# Patient Record
Sex: Female | Born: 1975 | Race: Black or African American | Hispanic: No | Marital: Single | Smoking: Never smoker
Health system: Southern US, Community
[De-identification: ages and names within clinical notes are randomized; demographics above are authoritative.]

## PROBLEM LIST (undated history)

## (undated) DIAGNOSIS — G800 Spastic quadriplegic cerebral palsy: Secondary | ICD-10-CM

## (undated) DIAGNOSIS — Z87440 Personal history of urinary (tract) infections: Secondary | ICD-10-CM

## (undated) DIAGNOSIS — G40909 Epilepsy, unspecified, not intractable, without status epilepticus: Secondary | ICD-10-CM

## (undated) DIAGNOSIS — F32A Depression, unspecified: Secondary | ICD-10-CM

## (undated) DIAGNOSIS — E785 Hyperlipidemia, unspecified: Secondary | ICD-10-CM

## (undated) DIAGNOSIS — L0390 Cellulitis, unspecified: Secondary | ICD-10-CM

## (undated) DIAGNOSIS — K219 Gastro-esophageal reflux disease without esophagitis: Secondary | ICD-10-CM

## (undated) DIAGNOSIS — D649 Anemia, unspecified: Secondary | ICD-10-CM

## (undated) DIAGNOSIS — F329 Major depressive disorder, single episode, unspecified: Secondary | ICD-10-CM

## (undated) HISTORY — DX: Depression, unspecified: F32.A

## (undated) HISTORY — DX: Spastic quadriplegic cerebral palsy: G80.0

## (undated) HISTORY — DX: Anemia, unspecified: D64.9

## (undated) HISTORY — DX: Personal history of urinary (tract) infections: Z87.440

## (undated) HISTORY — DX: Gastro-esophageal reflux disease without esophagitis: K21.9

## (undated) HISTORY — DX: Cellulitis, unspecified: L03.90

## (undated) HISTORY — DX: Major depressive disorder, single episode, unspecified: F32.9

## (undated) HISTORY — DX: Epilepsy, unspecified, not intractable, without status epilepticus: G40.909

## (undated) HISTORY — PX: REDUCTION MAMMAPLASTY: SUR839

## (undated) HISTORY — DX: Hyperlipidemia, unspecified: E78.5

---

## 1997-07-31 ENCOUNTER — Other Ambulatory Visit: Admission: RE | Admit: 1997-07-31 | Discharge: 1997-07-31 | Payer: Self-pay | Admitting: Obstetrics and Gynecology

## 1998-06-20 ENCOUNTER — Emergency Department (HOSPITAL_COMMUNITY): Admission: EM | Admit: 1998-06-20 | Discharge: 1998-06-20 | Payer: Self-pay | Admitting: Emergency Medicine

## 1998-06-20 ENCOUNTER — Encounter: Payer: Self-pay | Admitting: Emergency Medicine

## 1998-07-13 ENCOUNTER — Encounter: Admission: RE | Admit: 1998-07-13 | Discharge: 1998-10-11 | Payer: Self-pay | Admitting: Family Medicine

## 1999-10-24 ENCOUNTER — Ambulatory Visit (HOSPITAL_COMMUNITY): Admission: RE | Admit: 1999-10-24 | Discharge: 1999-10-24 | Payer: Self-pay | Admitting: General Surgery

## 1999-10-24 ENCOUNTER — Encounter (INDEPENDENT_AMBULATORY_CARE_PROVIDER_SITE_OTHER): Payer: Self-pay | Admitting: Specialist

## 2003-09-03 ENCOUNTER — Encounter: Admission: RE | Admit: 2003-09-03 | Discharge: 2003-09-03 | Payer: Self-pay | Admitting: Internal Medicine

## 2003-09-07 ENCOUNTER — Encounter: Admission: RE | Admit: 2003-09-07 | Discharge: 2003-09-07 | Payer: Self-pay | Admitting: Internal Medicine

## 2003-12-09 ENCOUNTER — Ambulatory Visit: Payer: Self-pay | Admitting: Internal Medicine

## 2004-03-01 ENCOUNTER — Ambulatory Visit: Payer: Self-pay | Admitting: Internal Medicine

## 2004-05-24 ENCOUNTER — Ambulatory Visit: Payer: Self-pay | Admitting: Internal Medicine

## 2004-07-14 ENCOUNTER — Ambulatory Visit: Payer: Self-pay | Admitting: Obstetrics and Gynecology

## 2004-07-21 ENCOUNTER — Ambulatory Visit (HOSPITAL_COMMUNITY): Admission: RE | Admit: 2004-07-21 | Discharge: 2004-07-21 | Payer: Self-pay | Admitting: Internal Medicine

## 2004-08-10 ENCOUNTER — Ambulatory Visit: Payer: Self-pay | Admitting: Internal Medicine

## 2004-08-17 ENCOUNTER — Ambulatory Visit: Payer: Self-pay | Admitting: Obstetrics and Gynecology

## 2004-12-06 ENCOUNTER — Ambulatory Visit: Payer: Self-pay | Admitting: Hospitalist

## 2005-01-23 ENCOUNTER — Ambulatory Visit: Payer: Self-pay | Admitting: Internal Medicine

## 2005-05-10 ENCOUNTER — Encounter: Admission: RE | Admit: 2005-05-10 | Discharge: 2005-08-08 | Payer: Self-pay | Admitting: Internal Medicine

## 2005-06-27 DIAGNOSIS — Z87440 Personal history of urinary (tract) infections: Secondary | ICD-10-CM

## 2005-06-27 DIAGNOSIS — L0390 Cellulitis, unspecified: Secondary | ICD-10-CM

## 2005-06-27 HISTORY — DX: Personal history of urinary (tract) infections: Z87.440

## 2005-06-27 HISTORY — DX: Cellulitis, unspecified: L03.90

## 2005-07-19 ENCOUNTER — Inpatient Hospital Stay (HOSPITAL_COMMUNITY): Admission: AD | Admit: 2005-07-19 | Discharge: 2005-07-25 | Payer: Self-pay | Admitting: Internal Medicine

## 2005-07-19 ENCOUNTER — Ambulatory Visit: Payer: Self-pay | Admitting: Internal Medicine

## 2005-07-22 ENCOUNTER — Encounter (INDEPENDENT_AMBULATORY_CARE_PROVIDER_SITE_OTHER): Payer: Self-pay | Admitting: *Deleted

## 2005-07-23 ENCOUNTER — Encounter: Payer: Self-pay | Admitting: Vascular Surgery

## 2005-07-24 ENCOUNTER — Encounter (INDEPENDENT_AMBULATORY_CARE_PROVIDER_SITE_OTHER): Payer: Self-pay | Admitting: *Deleted

## 2005-08-01 ENCOUNTER — Ambulatory Visit: Payer: Self-pay | Admitting: Internal Medicine

## 2005-08-08 ENCOUNTER — Ambulatory Visit: Payer: Self-pay | Admitting: Internal Medicine

## 2006-01-05 DIAGNOSIS — G809 Cerebral palsy, unspecified: Secondary | ICD-10-CM | POA: Insufficient documentation

## 2006-01-05 DIAGNOSIS — R569 Unspecified convulsions: Secondary | ICD-10-CM | POA: Insufficient documentation

## 2006-01-05 DIAGNOSIS — D638 Anemia in other chronic diseases classified elsewhere: Secondary | ICD-10-CM | POA: Insufficient documentation

## 2006-03-03 DIAGNOSIS — K219 Gastro-esophageal reflux disease without esophagitis: Secondary | ICD-10-CM | POA: Insufficient documentation

## 2006-08-09 ENCOUNTER — Ambulatory Visit: Payer: Self-pay | Admitting: Internal Medicine

## 2006-09-01 ENCOUNTER — Emergency Department (HOSPITAL_COMMUNITY): Admission: EM | Admit: 2006-09-01 | Discharge: 2006-09-01 | Payer: Self-pay | Admitting: Emergency Medicine

## 2006-09-05 ENCOUNTER — Ambulatory Visit: Payer: Self-pay | Admitting: Hospitalist

## 2007-05-31 ENCOUNTER — Encounter (INDEPENDENT_AMBULATORY_CARE_PROVIDER_SITE_OTHER): Payer: Self-pay | Admitting: *Deleted

## 2007-05-31 ENCOUNTER — Ambulatory Visit: Payer: Self-pay | Admitting: Hospitalist

## 2007-07-19 ENCOUNTER — Encounter (INDEPENDENT_AMBULATORY_CARE_PROVIDER_SITE_OTHER): Payer: Self-pay | Admitting: *Deleted

## 2007-08-29 ENCOUNTER — Encounter (INDEPENDENT_AMBULATORY_CARE_PROVIDER_SITE_OTHER): Payer: Self-pay | Admitting: *Deleted

## 2007-12-26 ENCOUNTER — Ambulatory Visit: Payer: Self-pay | Admitting: Internal Medicine

## 2008-08-06 ENCOUNTER — Ambulatory Visit: Payer: Self-pay | Admitting: Internal Medicine

## 2008-08-07 ENCOUNTER — Encounter (INDEPENDENT_AMBULATORY_CARE_PROVIDER_SITE_OTHER): Payer: Self-pay | Admitting: *Deleted

## 2008-08-07 DIAGNOSIS — G8114 Spastic hemiplegia affecting left nondominant side: Secondary | ICD-10-CM | POA: Insufficient documentation

## 2008-08-07 DIAGNOSIS — F329 Major depressive disorder, single episode, unspecified: Secondary | ICD-10-CM

## 2008-09-02 ENCOUNTER — Telehealth: Payer: Self-pay | Admitting: *Deleted

## 2008-09-15 ENCOUNTER — Telehealth (INDEPENDENT_AMBULATORY_CARE_PROVIDER_SITE_OTHER): Payer: Self-pay | Admitting: *Deleted

## 2008-10-28 ENCOUNTER — Encounter (INDEPENDENT_AMBULATORY_CARE_PROVIDER_SITE_OTHER): Payer: Self-pay | Admitting: Internal Medicine

## 2008-12-18 ENCOUNTER — Ambulatory Visit: Payer: Self-pay | Admitting: Internal Medicine

## 2009-03-12 ENCOUNTER — Telehealth (INDEPENDENT_AMBULATORY_CARE_PROVIDER_SITE_OTHER): Payer: Self-pay | Admitting: *Deleted

## 2009-03-17 ENCOUNTER — Telehealth (INDEPENDENT_AMBULATORY_CARE_PROVIDER_SITE_OTHER): Payer: Self-pay | Admitting: Internal Medicine

## 2009-04-07 ENCOUNTER — Telehealth (INDEPENDENT_AMBULATORY_CARE_PROVIDER_SITE_OTHER): Payer: Self-pay | Admitting: Internal Medicine

## 2009-04-27 ENCOUNTER — Encounter: Payer: Self-pay | Admitting: Internal Medicine

## 2009-05-11 ENCOUNTER — Ambulatory Visit: Payer: Self-pay | Admitting: Infectious Diseases

## 2009-05-11 DIAGNOSIS — L0292 Furuncle, unspecified: Secondary | ICD-10-CM | POA: Insufficient documentation

## 2009-05-11 DIAGNOSIS — L0293 Carbuncle, unspecified: Secondary | ICD-10-CM

## 2009-05-12 ENCOUNTER — Encounter (INDEPENDENT_AMBULATORY_CARE_PROVIDER_SITE_OTHER): Payer: Self-pay | Admitting: Internal Medicine

## 2009-05-20 ENCOUNTER — Telehealth (INDEPENDENT_AMBULATORY_CARE_PROVIDER_SITE_OTHER): Payer: Self-pay | Admitting: Internal Medicine

## 2009-06-09 ENCOUNTER — Telehealth (INDEPENDENT_AMBULATORY_CARE_PROVIDER_SITE_OTHER): Payer: Self-pay | Admitting: Internal Medicine

## 2009-07-06 ENCOUNTER — Telehealth: Payer: Self-pay | Admitting: *Deleted

## 2009-07-08 ENCOUNTER — Ambulatory Visit: Payer: Self-pay | Admitting: Internal Medicine

## 2009-07-08 ENCOUNTER — Encounter (INDEPENDENT_AMBULATORY_CARE_PROVIDER_SITE_OTHER): Payer: Self-pay | Admitting: Internal Medicine

## 2009-07-09 LAB — CONVERTED CEMR LAB
HCT: 34.2 % — ABNORMAL LOW (ref 36.0–46.0)
Lymphocytes Relative: 49 % — ABNORMAL HIGH (ref 12–46)
Lymphs Abs: 3.4 10*3/uL (ref 0.7–4.0)
MCV: 81.8 fL (ref >=78.0)
Monocytes Relative: 10 % (ref 3–12)
Neutro Abs: 2.6 10*3/uL (ref 1.7–7.7)
Neutrophils Relative %: 37 % — ABNORMAL LOW (ref 43–77)
Platelets: 259 10*3/uL (ref 150–400)
RDW: 14.3 % (ref 11.5–15.5)

## 2009-07-14 ENCOUNTER — Telehealth (INDEPENDENT_AMBULATORY_CARE_PROVIDER_SITE_OTHER): Payer: Self-pay | Admitting: Internal Medicine

## 2009-07-16 ENCOUNTER — Ambulatory Visit: Payer: Self-pay | Admitting: Infectious Disease

## 2009-07-16 LAB — CONVERTED CEMR LAB
ALT: 8 units/L (ref 0–35)
AST: 16 units/L (ref 0–37)
Albumin: 4.4 g/dL (ref 3.5–5.2)
Creatinine, Ser: 0.39 mg/dL — ABNORMAL LOW (ref 0.40–1.20)
HCT: 35.6 % — ABNORMAL LOW (ref 36.0–46.0)
Hemoglobin: 11.9 g/dL — ABNORMAL LOW (ref 12.0–15.0)
MCHC: 33.4 g/dL (ref 30.0–36.0)
Platelets: 359 10*3/uL (ref 150–400)
Potassium: 3.9 meq/L (ref 3.5–5.3)
Total Bilirubin: 0.2 mg/dL — ABNORMAL LOW (ref 0.3–1.2)
Total Protein: 7.2 g/dL (ref 6.0–8.3)
Valproic Acid Lvl: 71.7 ug/mL (ref >=50.0)
WBC: 8.1 10*3/uL (ref 4.0–10.5)

## 2009-08-02 ENCOUNTER — Encounter (INDEPENDENT_AMBULATORY_CARE_PROVIDER_SITE_OTHER): Payer: Self-pay | Admitting: Internal Medicine

## 2009-10-01 ENCOUNTER — Encounter: Payer: Self-pay | Admitting: Internal Medicine

## 2009-10-06 ENCOUNTER — Encounter: Payer: Self-pay | Admitting: Internal Medicine

## 2009-11-17 ENCOUNTER — Encounter: Payer: Self-pay | Admitting: Internal Medicine

## 2009-11-17 ENCOUNTER — Ambulatory Visit: Payer: Self-pay | Admitting: Internal Medicine

## 2009-11-17 DIAGNOSIS — L84 Corns and callosities: Secondary | ICD-10-CM

## 2009-11-29 LAB — CONVERTED CEMR LAB
Basophils Absolute: 0.1 10*3/uL (ref 0.0–0.1)
Basophils Relative: 1 % (ref 0–1)
CO2: 22 meq/L (ref 19–32)
Calcium: 9.1 mg/dL (ref 8.4–10.5)
Creatinine, Ser: 0.46 mg/dL (ref 0.40–1.20)
Eosinophils Absolute: 0.3 10*3/uL (ref 0.0–0.7)
Hemoglobin: 11.9 g/dL — ABNORMAL LOW (ref 12.0–15.0)
LDL Cholesterol: 138 mg/dL — ABNORMAL HIGH (ref 0–99)
MCHC: 34.5 g/dL (ref 30.0–36.0)
MCV: 81.8 fL (ref >=78.0)
Monocytes Absolute: 0.5 10*3/uL (ref 0.1–1.0)
Neutrophils Relative %: 41 % — ABNORMAL LOW (ref 43–77)
RBC: 4.22 M/uL (ref 3.87–5.11)
RDW: 14.4 % (ref 11.5–15.5)
Sodium: 141 meq/L (ref 135–145)
WBC: 8.2 10*3/uL (ref 4.0–10.5)

## 2009-11-30 ENCOUNTER — Encounter: Payer: Self-pay | Admitting: Internal Medicine

## 2009-11-30 ENCOUNTER — Telehealth: Payer: Self-pay | Admitting: *Deleted

## 2009-12-30 ENCOUNTER — Telehealth: Payer: Self-pay | Admitting: *Deleted

## 2009-12-30 ENCOUNTER — Encounter
Admission: RE | Admit: 2009-12-30 | Discharge: 2010-02-16 | Payer: Self-pay | Source: Home / Self Care | Attending: Internal Medicine | Admitting: Internal Medicine

## 2010-01-10 ENCOUNTER — Encounter: Payer: Self-pay | Admitting: Internal Medicine

## 2010-01-13 ENCOUNTER — Encounter: Payer: Self-pay | Admitting: Internal Medicine

## 2010-03-15 ENCOUNTER — Encounter: Payer: Self-pay | Admitting: Internal Medicine

## 2010-03-21 ENCOUNTER — Encounter: Payer: Self-pay | Admitting: Internal Medicine

## 2010-03-29 NOTE — Miscellaneous (Signed)
Summary: Orders Update   Clinical Lists Changes  Orders: Added new Referral order of Physical Therapy Referral (PT) - Signed 

## 2010-03-29 NOTE — Medication Information (Signed)
[facility]: [facility]   Imported By: Margie Billet 05/28/2009 11:41:54  _____________________________________________________________________  External Attachment:    Type:   Image     Comment:   External Document

## 2010-03-29 NOTE — Progress Notes (Signed)
  Phone Note Other Incoming   Caller: CARE GIVER FOR [name] Details for Reason: REQUESTING PT EVAL Summary of Call: TRACY FORD- CARE GIVER FOR [name] CALLED , REQUESTIN PT EVAL FOR WHEELCHAIR/SHOWERCHAIR FOR [name].  SHE SAID THAT THEY HAD GIVEN [name] A SHOWER BENCH, BUT [name] FELL OFF THE BENCH.   HER CALL BACK NUMBER IS [phone] / FAX NUMBER IS [phone]. THANKS LELA

## 2010-03-29 NOTE — Progress Notes (Signed)
  Phone Note Outgoing Call   Call placed by: Theotis Barrio NT II,  Jul 06, 2009 12:41 PM Call placed to: Specialist Details for Reason: CASE MANAGER/DENIES LEE [phone] Summary of Call: SPOKE WITH DENIES THE CASE MANAGE FOR [name]. ASK HER TO FAX THE REQUEST FOR PT THAT SHE IS TRYING TO GET FOR [name]. THE REFERRAL I FAXED TO PT WAS FORWARDED TO [facility]. THEY ARE UNABLE TO HELP. ASKED HER TO FAXE THIS REQUEST TO [phone] ATTENTION LELA / WITH CALL BACK # [phone]. FOR WHAT EVER REASON THE CASE MANAGE HAS NOT BEEN SUCCESFUL IN GETTING PT SERVICE FROM ANYONE TO COME TO DO THIS EVALUATION. WILL DO WHAT I CAN TO HELP THEM GET DONE, TO GET THE BOTTOMLESS SHOWER CHAIR.  THANKS LELA .

## 2010-03-29 NOTE — Assessment & Plan Note (Signed)
Summary: open  boil on stomach/cfb   Vital Signs:  Patient profile:   35 year old female Height:      64 inches Pulse rate:   71 / minute BP sitting:   102 / 66  (right arm)  Vitals Entered By: Filomena Jungling NT II (May 11, 2009 3:29 PM) CC: BOIL ON  STOMACH Is Patient Diabetic? No Pain Assessment Patient in pain? no       Have you ever been in a relationship where you felt threatened, hurt or afraid?No   Does patient need assistance? Functional Status Self care Ambulation Impaired:Risk for fall Comments [facility]IVES IN [facility]   CC:  BOIL ON  STOMACH.  History of Present Illness: [name] is a 35 yo F with cerebral palsy and seizure d/o who presents for a boil on her stomach. Her caretaker in the group home she inhabitates said that she noticed her scratching her lower stomach on Friday and noticed a red lesion. There was a corresponding red area on her upper thigh where it met the other red lesion through skin folds. She says she has had these before but it does not seem that she has been seen here previously for it. She denies seeing any blistering or drainage, fevers, or chills.  Current Medications (verified): 1)  Depo-Provera 150 Mg/ml Susp (Medroxyprogesterone Acetate) .Marland Kitchen.. 1 Injection Every 3 Months 2)  Depakote Er 250 Mg Tb24 (Divalproex Sodium) .... 250mg  Am and 500mg  Q Hs 3)  Neurontin  Tabs (Gabapentin Tabs) .... 1200mg  By Mouth in The Evening 4)  Protonix 40 Mg Tbec (Pantoprazole Sodium) .... Once Daily 5)  Zoloft 100 Mg Tabs (Sertraline Hcl) .... Once Daily 6)  Oscal 500/200 D-3 500-200 Mg-Unit Tabs (Calcium-Vitamin D) .... Take 1 Tablet By Mouth Two Times A Day 7)  Baclofen 10 Mg  Tabs (Baclofen) .... Take 1 Tablet By Mouth Three Times A Day 8)  Zyrtec Allergy 10 Mg  Tabs (Cetirizine Hcl) .... Take 1 Tablet By Mouth At Bedtime 9)  Tylenol Arthritis Pain 650 Mg Cr-Tabs (Acetaminophen) .... Take 1 Tablet By Mouth Three Times A Day 10)  Wheelchair  Misc  (Misc. Devices) .... Manual Wheechair, Dx Cerebral Palsy (343.9) 11)  Benadryl 25 Mg Caps (Diphenhydramine Hcl) .... Take 1 Tablet At Bedtime As Needed For Itching  Allergies (verified): 1)  ! Dilantin 2)  ! Phenobarbital (Phenobarbital)  Past History:  Past Medical History: Last updated: 01/05/2006 Anemia-NOS Seizure disorder Cerebral palsy with spasticity Depression GERD hx of hemorrhagic UTI in 06/2005 hx of R orbital cellulitis, LUE and R pretibial cellulitis in 06/2005  Family History: Last updated: 08/09/2006 No family hx of diabetes, CAD or SZ disorder.  Social History: Last updated: 08/09/2006 Lives in a group home.  Risk Factors: Smoking Status: never (08/06/2008)  Review of Systems      See HPI  Physical Exam  General:  Well-developed,well-nourished,in no acute distress; alert,appropriate and cooperative throughout examination Head:  Normocephalic and atraumatic without obvious abnormalities. No apparent alopecia or balding. Neurologic:  alert & oriented X3. Pt with cerebral palsy. Skin:  ulcerated 1 cm red lesion on R suprapubic area with similar lesion in upper thigh where they meet via skin fold. Small area of induration palpable. No surrounding erythema or warmth, and no fluid was able to be expressed. Two old, healed lesions on analogous locations of L side. Psych:  Oriented X3.     Impression & Recommendations:  Problem # 1:  FURUNCLE (ICD-680.9) Her lesions  are consistent with a furuncle that she opened up by scratching. I spoke with Dr. Aundria Rud about this (and he examined the patient as well), and they should heal on their own. No evidence of infection. We prescribed Benadryl for her to take at bedtime for itching and encouraged her to not scratch it, if possible. She understood.  Problem # 2:  SEIZURE DISORDER (ICD-780.39) She has not been prescribed Depakote here for a few years, and according to her caretaker, she gets labs done regularly at  [facility] (mental?). So I presume she gets this medication from them and they are monitoring her levels. Nothing further done at this visit.  Her updated medication list for this problem includes:    Depakote Er 250 Mg Tb24 (Divalproex sodium) ..... 250mg  am and 500mg  q hs    Neurontin Tabs (Gabapentin tabs) ..... 1200mg  by mouth in the evening  Complete Medication List: 1)  Depo-provera 150 Mg/ml Susp (Medroxyprogesterone acetate) .Marland Kitchen.. 1 injection every 3 months 2)  Depakote Er 250 Mg Tb24 (Divalproex sodium) .... 250mg  am and 500mg  q hs 3)  Neurontin Tabs (Gabapentin tabs) .... 1200mg  by mouth in the evening 4)  Protonix 40 Mg Tbec (Pantoprazole sodium) .... Once daily 5)  Zoloft 100 Mg Tabs (Sertraline hcl) .... Once daily 6)  Oscal 500/200 D-3 500-200 Mg-unit Tabs (Calcium-vitamin d) .... Take 1 tablet by mouth two times a day 7)  Baclofen 10 Mg Tabs (Baclofen) .... Take 1 tablet by mouth three times a day 8)  Zyrtec Allergy 10 Mg Tabs (Cetirizine hcl) .... Take 1 tablet by mouth at bedtime 9)  Tylenol Arthritis Pain 650 Mg Cr-tabs (Acetaminophen) .... Take 1 tablet by mouth three times a day 10)  Wheelchair Misc (Misc. devices) .... Manual wheechair, dx cerebral palsy (343.9) 11)  Benadryl 25 Mg Caps (Diphenhydramine hcl) .... Take 1 tablet at bedtime as needed for itching  Patient Instructions: 1)  Please schedule an appointment as needed. 2)  I have prescribed Benadryl for you to take at night as needed for itching. Try not to scratch the area because it won't heal as quickly. Prescriptions: BENADRYL 25 MG CAPS (DIPHENHYDRAMINE HCL) take 1 tablet at bedtime as needed for itching  #30 x 3   Entered and Authorized by:   Silvestre Gunner MD   Signed by:   Silvestre Gunner MD on 05/11/2009   Method used:   Print then Give to Patient   RxID:   [id number]   Prevention & Chronic Care Immunizations   Influenza vaccine: Fluvax Non-MCR  (12/18/2008)    Tetanus booster: Not  documented    Pneumococcal vaccine: Not documented  Other Screening   Pap smear: Not documented   Smoking status: never  (08/06/2008)  Appended Document: open  boil on stomach/cfb Call from group home stating fax is not working so I called in rx for benadryl to [facility]

## 2010-03-29 NOTE — Progress Notes (Signed)
Summary: Shower Chair  Phone Note Outgoing Call   Call placed by: Angelina Ok RN,  March 12, 2009 12:29 PM Call placed to: Patient Summary of Call: Call from Vickie wants to get a shower chair for the pt.  Wants it delivered.to [address]/ A Group Home.  Order should go to [facility].  Vickie can be reached at [phone] when order is done. Angelina Ok RN  March 12, 2009 12:32 PM  Initial call taken by: Angelina Ok RN,  March 12, 2009 12:31 PM  Follow-up for Phone Call        ok to order- see med list Follow-up by: Julaine Fusi  DO,  March 12, 2009 12:51 PM  Additional Follow-up for Phone Call Additional follow up Details #1::        Order for Shower Chair faxed to [facility]. Additional Follow-up by: Angelina Ok RN,  March 12, 2009 1:43 PM    New/Updated Medications: ROUND SHOWER STOOL  MISC (MISC. DEVICES) Shower Chair per Med supply. Prescriptions: ROUND SHOWER STOOL  MISC (MISC. DEVICES) Shower Chair per Med supply.  #1 x 0   Entered by:   Julaine Fusi  DO   Authorized by:   Marland Kitchen [facility] ATTENDING DESKTOP   Signed by:   Julaine Fusi  DO on 03/12/2009   Method used:   Print then Give to Patient   RxID:   [id number]

## 2010-03-29 NOTE — Progress Notes (Signed)
Summary: phone/gg  Phone Note From Other Clinic   Summary of Call: Call from Group Home ( [facility])  Leader,  Alanson Puls asking for a Health and Safety assessment  to be done by PT for the patient. Phone # [phone] Fax # [phone]  Initial call taken by: Merrie Roof RN,  June 09, 2009 11:09 AM  Follow-up for Phone Call        PT referral sent. Follow-up by: Silvestre Gunner MD,  June 09, 2009 7:30 PM  Additional Follow-up for Phone Call Additional follow up Details #1::        Lela asked to complete referral Merrie Roof RN  June 16, 2009 9:05 AM

## 2010-03-29 NOTE — Medication Information (Signed)
[facility]: [facility]   Imported By: Margie Billet 08/17/2009 15:28:18  _____________________________________________________________________  External Attachment:    Type:   Image     Comment:   External Document

## 2010-03-29 NOTE — Progress Notes (Signed)
Summary: bedside commode/ hla  Phone Note Other Incoming   Summary of Call: group home calls pt needs a bedside commode, could we get a script for this, fax to [phone], contact person  stephanie edwards, ph# [phone] Initial call taken by: Marin Roberts RN,  March 17, 2009 12:15 PM    Additional Follow-up for Phone Call Additional follow up Details #2::    Do I have to call this in/fax it or can it be done by someone (Glenda?) in the clinic? I'm on night float right now.

## 2010-03-29 NOTE — Miscellaneous (Signed)
Summary: [facility]  [facility]   Imported By: Shon Hough 12/03/2009 14:49:01  _____________________________________________________________________  External Attachment:    Type:   Image     Comment:   External Document

## 2010-03-29 NOTE — Letter (Signed)
[facility]   Imported By: Shon Hough 11/02/2009 10:47:23  _____________________________________________________________________  External Attachment:    Type:   Image     Comment:   External Document

## 2010-03-29 NOTE — Assessment & Plan Note (Signed)
Summary: Paperwork, right knee callus, F/U seizures.   Vital Signs:  Patient profile:   35 year old female Height:      64 inches Temp:     97.4 degrees F oral Pulse rate:   80 / minute BP sitting:   107 / 68  (right arm)  Vitals Entered By: Filomena Jungling NT II (November 17, 2009 4:00 PM) CC: CALLOUS ON KNEE, NEED FLU SHOT Is Patient Diabetic? No Pain Assessment Patient in pain? no       Have you ever been in a relationship where you felt threatened, hurt or afraid?No   Does patient need assistance? Ambulation Impaired:Risk for fall, Wheelchair Comments GROUP HOME HELPS WITH [facility]   Primary Care Provider:  Johnette Abraham DO  CC:  CALLOUS ON KNEE and NEED FLU SHOT.  History of Present Illness: Pt is a 35 yo female with PMHx of traumatic brain injury, cerebral palsy, GERD who presents to clinic today accompanied by her caretaker from her Group Home with multiple concerns, including the following:  1) Paperwork - group home requesting updated paperwork regarding current medications, which include: Depakote (levels monitored frequently in her group home) - no new seizure activity since last visit. Neurontin, Zoloft, Oscal, Baclofen, Zyrtec.  2) Right knee callus and scab - Patient transports herself regularly from her wheelchair onto bed, etc by sliding onto her knees. She has developed chronic calluses on BL knees secondary to this frequent transport. However, she had noticed that 2 days ago, the scab came off of her Right knee callus, causing pain with manipulation. She usually wears a knee pad to cover the wound. No drainage, no pus, no warmth, no redness. No fevers, chills.   3) Preventative Care: Last PAP: reported within last few years with normal result. Not sexually active. No report documented. Will request. FLP: not documented Flu shot requested.     Depression History:      The patient denies a depressed mood most of the day and a diminished interest in her  usual daily activities.         Preventive Screening-Counseling & Management  Alcohol-Tobacco     Smoking Status: never  Pap Smear  Procedure date:  05/07/2009  Findings:      Interpretation/Result:Negative for intraepithelial Lesion or Malignancy.     Current Medications (verified): 1)  Depakote 500 Mg Tbec (Divalproex Sodium) .... Take 2 Tablet At Bedtime, and Half Table At Bed Time 2)  Neurontin  Tabs (Gabapentin Tabs) .... 1200mg  By Mouth in The Evening 3)  Zoloft 100 Mg Tabs (Sertraline Hcl) .... Once Daily 4)  Baclofen 10 Mg  Tabs (Baclofen) .... Take 1 Tablet By Mouth Three Times A Day 5)  Zyrtec Allergy 10 Mg  Tabs (Cetirizine Hcl) .... Take 1 Tablet By Mouth At Bedtime 6)  Tylenol Arthritis Pain 650 Mg Cr-Tabs (Acetaminophen) .... Take 1 Tablet By Mouth Three Times A Day 7)  Wheelchair  Misc (Misc. Devices) .... Manual Wheechair, Dx Cerebral Palsy (343.9) 8)  Benadryl 25 Mg Caps (Diphenhydramine Hcl) .... Take 1 Tablet At Bedtime As Needed For Itching 9)  Oyster Shell Calcium/d 500-200 Mg-Unit Tabs (Calcium-Vitamin D) .... Take 1 Tablet By Mouth Two Times A Day  Allergies: 1)  ! Dilantin 2)  ! Phenobarbital (Phenobarbital)  Review of Systems       Per HPI  Physical Exam  General:  Well-developed,well-nourished,in no acute distress; alert,appropriate and cooperative throughout examination Head:  Normocephalic and atraumatic without obvious abnormalities Mouth:  Oral mucosa and oropharynx without lesions or exudates.   Neck:  No deformities, masses, or tenderness noted. Lungs:  Normal respiratory effort, chest expands symmetrically. Lungs are clear to auscultation, no crackles or wheezes. Heart:  Normal rate and regular rhythm. S1 and S2 normal without gallop, murmur, click, rub or other extra sounds. Abdomen:  Bowel sounds positive,abdomen soft and non-tender without masses, organomegaly Extremities:  Right knee with an opened 1 cm scab over a hardened lesion,  healing, no surrounding erythema or warmth. No discharge of pus. Hardened callus over left knne.    Impression & Recommendations:  Problem # 1:  CALLUS (ICD-700) At this time, right knee lesion appears to be healing and does not look infected.  - Will recommend Duoderm dressing to be changed every 3-5 days - Recommended to continue to monitor for signs of infection including fevers, redness, warmth, discharge.   Problem # 2:  PREVENTIVE HEALTH CARE (ICD-V70.0) - Will repeat labs today, including lipid profile, BMP, CBC, TSH.  - Will request PAP smear results.  - Completed paperwork for Group Home, Re: updated medications list and med reactions.  Orders: T-Lipid Profile [id number]) T-Basic Metabolic Panel [phone])  Problem # 3:  SEIZURE DISORDER (ICD-780.39) Depakote levels checked regularly and adjusted at Group home, per Group home representative. No new seizures.  Her updated medication list for this problem includes:    Depakote 500 Mg Tbec (Divalproex sodium) .Marland Kitchen... Take 2 tablet at bedtime, and half table at bed time    Neurontin Tabs (Gabapentin tabs) ..... 1200mg  by mouth in the evening  Complete Medication List: 1)  Depakote 500 Mg Tbec (Divalproex sodium) .... Take 2 tablet at bedtime, and half table at bed time 2)  Neurontin Tabs (Gabapentin tabs) .... 1200mg  by mouth in the evening 3)  Zoloft 100 Mg Tabs (Sertraline hcl) .... Once daily 4)  Baclofen 10 Mg Tabs (Baclofen) .... Take 1 tablet by mouth three times a day 5)  Zyrtec Allergy 10 Mg Tabs (Cetirizine hcl) .... Take 1 tablet by mouth at bedtime 6)  Tylenol Arthritis Pain 650 Mg Cr-tabs (Acetaminophen) .... Take 1 tablet by mouth three times a day 7)  Wheelchair Misc (Misc. devices) .... Manual wheechair, dx cerebral palsy (343.9) 8)  Benadryl 25 Mg Caps (Diphenhydramine hcl) .... Take 1 tablet at bedtime as needed for itching 9)  Oyster Shell Calcium/d 500-200 Mg-unit Tabs (Calcium-vitamin d) .... Take 1  tablet by mouth two times a day 10)  Duoderm Signal Dressing Misc (Control gel formula dressing) .... Place dressing to right knee wound - change every 3-5 days until scab heals.  Other Orders: T-TSH [id number]) T-CBC w/Diff [id number])  Patient Instructions: 1)  Please follow-up at the clinic in 1year. 2)  You have been started on Duoderm dressings to be replaced every 3-5 days until resolution of your right knee scab.  3)  You will be having labs today. I will call you if any result is abnormal. 4)  If you have worsening of your symptoms, develop fevers, chills, redness, drainage from wound, please call the clinic. Prescriptions: DUODERM SIGNAL DRESSING  MISC (CONTROL GEL FORMULA DRESSING) Place dressing to right knee wound - change every 3-5 days until scab heals.  #10 x 2   Entered and Authorized by:   Johnette Abraham DO   Signed by:   Johnette Abraham DO on 11/17/2009   Method used:   Print then Give to Patient   RxID:   [id number]   Process Orders Check  Orders Results:     [facility]: ABN not required for this insurance Tests Sent for requisitioning (November 23, 2009 3:14 PM):     11/17/2009: [facility] -- T-Lipid Profile [id number] (signed)     11/17/2009: [facility] -- T-Basic Metabolic Panel [id number] (signed)     11/17/2009: [facility] -- T-TSH [phone] (signed)     11/17/2009: [facility] -- [facility] w/Diff [[id number]] (signed)     Prevention & Chronic Care Immunizations   Influenza vaccine: Fluvax Non-MCR  (12/18/2008)    Tetanus booster: Not documented    Pneumococcal vaccine: Not documented  Other Screening   Pap smear: Interpretation/Result:Negative for intraepithelial Lesion or Malignancy.     (05/07/2009)  Reports requested:   Last Pap report requested.  Smoking status: never  (11/17/2009)   Nursing Instructions: Request report of  last Pap   Process Orders Check Orders Results:     [facility]: ABN not required for this insurance Tests Sent for requisitioning (November 23, 2009 3:14 PM):     11/17/2009: [facility] -- T-Lipid Profile [id number] (signed)     11/17/2009: [facility] -- T-Basic Metabolic Panel [id number] (signed)     11/17/2009: [facility] -- T-TSH [phone] (signed)     11/17/2009: [facility] -- T-CBC w/Diff [[id number]] (signed)

## 2010-03-29 NOTE — Miscellaneous (Signed)
Summary: [facility]   Imported By: Louretta Parma 11/02/2009 15:13:42  _____________________________________________________________________  External Attachment:    Type:   Image     Comment:   External Document

## 2010-03-29 NOTE — Progress Notes (Signed)
----   Converted from flag ---- ---- 11/30/2009 12:07 PM, Johnette Abraham DO wrote: Thank you!! Sincerely, Johnette Abraham, D.O.   ---- 11/30/2009 9:51 AM, Chinita Pester RN wrote: I called and talked to [facility] supervisor, Lambert Mody.  I informed her of pt.'s cholesterol level being slightly elevated per Dr. Thad Ranger and faxed [phone]) over Dr. Thad Ranger' dietary recommendations per Tracie's request.  ---- 11/30/2009 8:44 AM, Jeanie Cooks Sturdivant NT II wrote: HI Dilan Fullenwider CAN YOU TAKE CARE OF THIS FOR ME?  THANKS LELA  ---- 11/29/2009 5:14 PM, Johnette Abraham DO wrote: Dorothy Puffer, can you please call Ms. Grassel and let her know I got her lab results, which show that her cholesterol is a little bit elevated. It is not high enough at this point to start medication. But, I would recommend her to make a few dietary changes that will help with bringing her total cholesterol and LDL cholesterol down. Specifically, she can increase the amount of fruits and vegetables in her diet (up to 6 servings daily), decrease foods with trans and saturated fats (such as margarines, cheese, butter, red meats, decrease fried foods, eat more lean meats (chicken, fish). We reassess at next visit. Thank you!  Sincerely, Johnette Abraham, D.O. ------------------------------
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
## 2010-03-29 NOTE — Letter (Signed)
Summary: [facility]   Imported By: Margie Billet 07/12/2009 12:04:19  _____________________________________________________________________  External Attachment:    Type:   Image     Comment:   External Document

## 2010-03-29 NOTE — Assessment & Plan Note (Signed)
Summary: acute-requesting physical for group home paperwork(cfb)/cfb   Vital Signs:  [name]:   35 year old female Height:      64 inches (162.56 cm) Weight:      165.2 pounds (75.09 kg) BMI:     28.46 Temp:     97.4 degrees F (36.33 degrees C) oral Pulse rate:   68 / minute BP sitting:   125 / 70  (right arm)  Vitals Entered By: Stanton Kidney Ditzler RN (Jul 08, 2009 2:53 PM) Is Paige Diabetic? No Pain Assessment Paige in pain? no      Nutritional Status BMI of 25 - 29 = overweight Nutritional Status Detail appetite good  Have you ever been in a relationship where you felt threatened, hurt or afraid?denies   Does Paige need assistance? Functional Status Self care Ambulation Wheelchair Comments Care giver with pt. Ck-up.   Primary Care Provider:  Silvestre Gunner MD   History of Present Illness: 35 year old with Past Medical History: Anemia-NOS Seizure disorder Cerebral palsy with spasticity Depression GERD hx of hemorrhagic UTI in 06/2005 hx of R orbital cellulitis, LUE and R pretibial cellulitis in 06/2005 She present for her annual physical exam. No new complaints. Her healthcare was with Paige in the room. She relates that Paige has been doing well. no new complaints.  I expend more than half hour filling forms.     Depression History:      The Paige denies a depressed mood most of the day and a diminished interest in her usual daily activities.         Preventive Screening-Counseling & Management  Alcohol-Tobacco     Smoking Status: never  Current Medications (verified): 1)  Depakote 500 Mg Tbec (Divalproex Sodium) .... Take 2 Tablet At Bedtime, and Half Table At Bed Time 2)  Neurontin  Tabs (Gabapentin Tabs) .... 1200mg  By Mouth in The Evening 3)  Zoloft 100 Mg Tabs (Sertraline Hcl) .... Once Daily 4)  Oscal 500/200 D-3 500-200 Mg-Unit Tabs (Calcium-Vitamin D) .... Take 1 Tablet By Mouth Two Times A Day 5)  Baclofen 10 Mg  Tabs (Baclofen)  .... Take 1 Tablet By Mouth Three Times A Day 6)  Zyrtec Allergy 10 Mg  Tabs (Cetirizine Hcl) .... Take 1 Tablet By Mouth At Bedtime 7)  Tylenol Arthritis Pain 650 Mg Cr-Tabs (Acetaminophen) .... Take 1 Tablet By Mouth Three Times A Day 8)  Wheelchair  Misc (Misc. Devices) .... Manual Wheechair, Dx Cerebral Palsy (343.9) 9)  Benadryl 25 Mg Caps (Diphenhydramine Hcl) .... Take 1 Tablet At Bedtime As Needed For Itching  Allergies: 1)  ! Dilantin 2)  ! Phenobarbital (Phenobarbital)  Review of Systems  The Paige denies fever, chest pain, syncope, dyspnea on exertion, peripheral edema, prolonged cough, headaches, hemoptysis, abdominal pain, melena, and hematochezia.    Physical Exam  General:  alert, well-developed, and well-nourished.   Head:  normocephalic, atraumatic, and no abnormalities observed.   Eyes:  pupils equal and pupils round.   Ears:  R ear normal and L ear normal.   Nose:  no external deformity, no external erythema, and no nasal discharge.   Mouth:  pharynx pink and moist.   Lungs:  normal respiratory effort, no intercostal retractions, no accessory muscle use, and normal breath sounds.   Heart:  normal rate and regular rhythm.   Abdomen:  soft, non-tender, normal bowel sounds, and no distention.   Extremities:  No edema.    Impression & Recommendations:  Problem # 1:  ANEMIA OF CHRONIC DISEASE (ICD-285.29) I will check CBC. Paige will need anemia panel if HB is low.  Orders: T-CBC w/Diff ([id number])  Problem # 2:  SEIZURE DISORDER (ICD-780.39) Manage by behavioral health.  Her updated medication list for this problem includes:    Depakote 500 Mg Tbec (Divalproex sodium) .Marland Kitchen... Take 2 tablet at bedtime, and half table at bed time    Neurontin Tabs (Gabapentin tabs) ..... 1200mg  by mouth in the evening  Complete Medication List: 1)  Depakote 500 Mg Tbec (Divalproex sodium) .... Take 2 tablet at bedtime, and half table at bed time 2)  Neurontin Tabs  (Gabapentin tabs) .... 1200mg  by mouth in the evening 3)  Zoloft 100 Mg Tabs (Sertraline hcl) .... Once daily 4)  Oscal 500/200 D-3 500-200 Mg-unit Tabs (Calcium-vitamin d) .... Take 1 tablet by mouth two times a day 5)  Baclofen 10 Mg Tabs (Baclofen) .... Take 1 tablet by mouth three times a day 6)  Zyrtec Allergy 10 Mg Tabs (Cetirizine hcl) .... Take 1 tablet by mouth at bedtime 7)  Tylenol Arthritis Pain 650 Mg Cr-tabs (Acetaminophen) .... Take 1 tablet by mouth three times a day 8)  Wheelchair Misc (Misc. devices) .... Manual wheechair, dx cerebral palsy (343.9) 9)  Benadryl 25 Mg Caps (Diphenhydramine hcl) .... Take 1 tablet at bedtime as needed for itching  Paige Instructions: 1)  Please schedule a follow-up appointment in 6 months.  Prevention & Chronic Care Immunizations   Influenza vaccine: Fluvax Non-MCR  (12/18/2008)    Tetanus booster: Not documented    Pneumococcal vaccine: Not documented  Other Screening   Pap smear: Not documented   Smoking status: never  (07/08/2009)      Resource handout printed.  Process Orders Check Orders Results:     [facility]: ABN not required for this insurance Tests Sent for requisitioning (Jul 09, 2009 9:29 AM):     07/08/2009: [facility] -- [facility] w/Diff [[id number]] (signed)    Process Orders Check Orders Results:     [facility]: ABN not required for this insurance Tests Sent for requisitioning (Jul 09, 2009 9:29 AM):     07/08/2009: [facility] -- [facility] w/Diff [[id number]] (signed)

## 2010-03-29 NOTE — Progress Notes (Signed)
Summary: phone/gg  Phone Note Call from Patient   Summary of Call: Call from [facility], care giver at group home.  They want a PT evaluation for bottomless shower chair. They need a written Rx for the evaluation and they will call PT to come out to the home. Fax # [phone]  Initial call taken by: Merrie Roof RN,  May 20, 2009 11:52 AM  Follow-up for Phone Call        Dr Camela Wich out.  I went ahead and did the PT referral.  Anything else I need to do?  thanks Follow-up by: Blanch Media MD,  May 21, 2009 2:17 PM  Additional Follow-up for Phone Call Additional follow up Details #1::        The referral has been faxed.  They will call if a written Rx is needed. Additional Follow-up by: Merrie Roof RN,  May 24, 2009 11:47 AM

## 2010-03-29 NOTE — Miscellaneous (Signed)
Summary: [facility]   Imported By: Shon Hough 11/22/2009 16:51:16  _____________________________________________________________________  External Attachment:    Type:   Image     Comment:   External Document

## 2010-03-29 NOTE — Progress Notes (Signed)
  Phone Note From Other Clinic   Caller: DANA / [facility] Call For: [name] STUDIVANT NTII Details for Reason: UNHABLE TO TO HELP PATIENT WITH BOTTOMLESS CHAIR Summary of Call: DANA FROM [facility] SAYS THAT THEY ARE UNABLE TO HELP WITH GETTING A BOTTOMLESS SHOWER CHAIR, SHE SAID THAT WE NEED TO GET IN TOUCH WITH HER CASE MANAGER. WILL CALL PATIENT TO FIND OUT HER CASE MANAGER AND CONTINUR FROM THERE. Waynette Towers NT II  Jul 06, 2009 12:21 PM

## 2010-03-29 NOTE — Progress Notes (Signed)
Summary: phone/gg  Phone Note From Other Clinic   Summary of Call: received call from Barrett from [phone])  She would like a referral to a GYN for pt. It's time for her annual exam.  She has not been having routine exams and does have vaginal d/c. Initial call taken by: Merrie Roof RN,  April 07, 2009 10:22 AM  Follow-up for Phone Call        That's fine. Will order GYN referral. Follow-up by: Silvestre Gunner MD,  April 14, 2009 2:00 PM  New Problems: VAGINAL DISCHARGE (ICD-623.5)   New Problems: VAGINAL DISCHARGE (ICD-623.5)  Appended Document: phone/gg referral request printed and given to [facility] to process

## 2010-03-29 NOTE — Miscellaneous (Signed)
Summary: REHABILITATION - PHYSICAL THERAPY ORDERS  REHABILITATION - PHYSICAL THERAPY ORDERS   Imported By: Shon Hough 01/18/2010 11:10:30  _____________________________________________________________________  External Attachment:    Type:   Image     Comment:   External Document

## 2010-03-29 NOTE — Progress Notes (Signed)
Summary: phone/gg  Phone Note From Other Clinic   Summary of Call: Call from [place] at group home stating pt had a seizure at 1220 today. This is the senerio that was given to me. Pt was sitting in wheelchair, noted to be staring in space, this lasted about 1 minute. When questioned  after this episode,  [name] was  able to tell the day, she knew her middle name only.  no changes in color, no vomiting,  She was able to push chair with right foot DURING this seizure. Group home director states she has to call and let us know when this happens.  Pt is now back to her normal.  I told them that I would document this and let her MD know.   Initial call taken by: Merrie Roof RN,  Jul 14, 2009 3:17 PM  Follow-up for Phone Call        We should check a valproic acid level on this patient. She apparently had been subtherapeutic on valproic acid in the past when she had a seizure. The last note indicates taht behavioral health is managing the seizure disorder (though that seems a bit unsusual). If we have a slot to work her into later today to check the valproic acid level that would be a good idea. Maybe they can check it in the group home and fax it to [place]?  Follow-up by: Acey Lav MD,  Jul 16, 2009 9:55 AM

## 2010-03-31 NOTE — Miscellaneous (Signed)
Summary: REHAB-INITIAL SUMMARY FOR PT  REHAB-INITIAL SUMMARY FOR PT   Imported By: Margie Billet 03/25/2010 15:52:08  _____________________________________________________________________  External Attachment:    Type:   Image     Comment:   External Document

## 2010-03-31 NOTE — Letter (Signed)
Summary: [facility]  [facility]   Imported By: Margie Billet 03/25/2010 16:44:34  _____________________________________________________________________  External Attachment:    Type:   Image     Comment:   External Document

## 2010-04-07 ENCOUNTER — Telehealth: Payer: Self-pay | Admitting: *Deleted

## 2010-04-07 NOTE — Telephone Encounter (Signed)
Request approved. Handwritten Rx given to [place].  KALIA-REYNOLDS,M SHELLY

## 2010-04-07 NOTE — Telephone Encounter (Signed)
Call from [facility] @ [address] [facility]. Pt had open callus on rt knee and it was treated DuoDerm.  Area is now healed and they need written order to d/c DuoDerm It needs to be faxed into group home. Fax # [phone] ATT: Tresa Endo

## 2010-05-11 ENCOUNTER — Telehealth: Payer: Self-pay | Admitting: *Deleted

## 2010-05-11 NOTE — Telephone Encounter (Signed)
Call from pt's caregiver needs an order for an Immunologist.  Order needs to faxed to [phone].

## 2010-05-13 NOTE — Telephone Encounter (Signed)
Hi Gladys, I left a prescription for this on your table. Please fax accordingly. Thank you!  Sincerely,  Johnette Abraham, D.O.

## 2010-05-16 NOTE — Telephone Encounter (Signed)
Order was faxed to pt's caregiver on Friday.

## 2010-06-02 ENCOUNTER — Ambulatory Visit (INDEPENDENT_AMBULATORY_CARE_PROVIDER_SITE_OTHER): Payer: Self-pay | Admitting: Internal Medicine

## 2010-06-02 ENCOUNTER — Encounter: Payer: Self-pay | Admitting: Internal Medicine

## 2010-06-02 DIAGNOSIS — J309 Allergic rhinitis, unspecified: Secondary | ICD-10-CM

## 2010-06-02 DIAGNOSIS — Z23 Encounter for immunization: Secondary | ICD-10-CM

## 2010-06-02 DIAGNOSIS — Z0000 Encounter for general adult medical examination without abnormal findings: Secondary | ICD-10-CM

## 2010-06-02 MED ORDER — FLUTICASONE PROPIONATE 50 MCG/ACT NA SUSP: 2.0000 | Freq: Every day | NASAL | Status: DC

## 2010-06-02 MED ORDER — CETIRIZINE HCL 10 MG PO TABS: 10.0000 mg | ORAL_TABLET | Freq: Every day | ORAL | Status: AC

## 2010-06-02 NOTE — Patient Instructions (Signed)
1) Please follow-up at the clinic in 3-6 months, at which time we will reevaluate chronic medical conditions. 2) You have been started on new medication(s), if you develop throat closing, tongue swelling, rash, please stop the medication and call the clinic at [phone] and go to the ER. 3) Please bring all of your medications in a bag to your next visit. 4) If you are diabetic, please bring your meter to your next visit. 5) If symptoms worsen, or new symptoms arise, please call the clinic or go to the ER.

## 2010-06-03 ENCOUNTER — Encounter: Payer: Self-pay | Admitting: Internal Medicine

## 2010-06-10 ENCOUNTER — Encounter: Payer: Self-pay | Admitting: Internal Medicine

## 2010-06-10 DIAGNOSIS — Z0000 Encounter for general adult medical examination without abnormal findings: Secondary | ICD-10-CM | POA: Insufficient documentation

## 2010-06-10 DIAGNOSIS — J309 Allergic rhinitis, unspecified: Secondary | ICD-10-CM | POA: Insufficient documentation
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
## 2010-06-10 NOTE — Assessment & Plan Note (Signed)
Continue zyrtec, add fluticasone.

## 2010-06-10 NOTE — Assessment & Plan Note (Signed)
-   Tdap today. - Fill out paperwork for yearly physical, which took approximately 30 minutes in addition to clinic time spent with the patient.  - Follows with [facility] for gyn related matters, does not require regular PAP smears secondary to no prior sexual activity.

## 2010-07-04 ENCOUNTER — Telehealth: Payer: Self-pay | Admitting: *Deleted

## 2010-07-04 NOTE — Telephone Encounter (Signed)
Pt lives in group home, ms weekly calls stating that pt had mentioned that years ago she had breast reduction surgery but the home has no record of it i have briefly reviewed the pt's paper chart, centricity chart and epic w/ nothing being found r/t breast reduction. Ms weekly states while bathing pt yesterday a blue string was found under pt's breast and it is wondered if this could be suture, she states there are no open areas or areas of concern on either breast but desires an appt for pt to have this looked at, she states the string is in a plastic bag and is instructed to bring it to appt.

## 2010-07-04 NOTE — Telephone Encounter (Signed)
Was the blue string penetrating through the skin or was it a loose thread?  Had she been wearing any blue clothing prior to the bath?  Not sure a clinic appointment is necessary if the answer to the first question is no but it is not unreasonable to do a good breat examination to assure there are no breaks in the skin that could represent a retained suture.

## 2010-07-05 ENCOUNTER — Ambulatory Visit (INDEPENDENT_AMBULATORY_CARE_PROVIDER_SITE_OTHER): Payer: Medicaid Other | Admitting: Internal Medicine

## 2010-07-05 VITALS — BP 102/66 | HR 51 | Temp 97.8°F | Wt 150.2 lb

## 2010-07-05 DIAGNOSIS — T148XXA Other injury of unspecified body region, initial encounter: Secondary | ICD-10-CM

## 2010-07-05 DIAGNOSIS — W458XXA Other foreign body or object entering through skin, initial encounter: Secondary | ICD-10-CM

## 2010-07-05 NOTE — Progress Notes (Signed)
  Subjective:    Patient ID: [name], female    DOB: March 01, 1975, 35 y.o.   MRN: [id number]  HPI Remote history of breast surgery Had a buried proline suture in the healed incision on the base of the left breast that worked its way to the surface-her aide seeing it, pulled on it and about a 2 inch suture pulled out They were uderstandably concerned about it-brought the suture in to confirm   Review of Systems     Objective:   Physical Exam  Breast scar well healed-no inflammation at site of suture, not able to express any drainage    Assessment & Plan:   Pt reassured this was simply a SQ suture that worked its way out-nothing needs doing-keep area clean and dry, RTC for any signs of infection

## 2010-07-05 NOTE — Telephone Encounter (Signed)
i ask these questions and was told they were sure it was suture.

## 2010-07-05 NOTE — Patient Instructions (Signed)
COME BACK IF THE AREA OF THE SCAR GETS RED SORE, OR HAS ANY DRAINAGE.  KEEP THAT AREA OF THE LEFT BREAT CLEAN AND DRY

## 2010-07-15 NOTE — Group Therapy Note (Signed)
[name], [name]            ACCOUNT NO.:  [id number]   MEDICAL RECORD NO.:  [id number]          PATIENT TYPE:  WOC   LOCATION:  [facility]                   FACILITY:  [facility]   PHYSICIAN:  Argentina Donovan, MD        DATE OF BIRTH:  1975/04/29   DATE OF SERVICE:  07/14/2004                                    CLINIC NOTE   CHIEF COMPLAINT:  Abnormal bleeding.   HISTORY OF PRESENT ILLNESS:  This is a 35 year old African-American female  with a history of spastic quadriplegia and moderate mental retardation,  coming in today after being referred by [facility] for  abnormal bleeding __________ tissue.  She was begun on Depo-Provera 150 mg  in July of 2005 in an effort to stop her menses as menses were very messy  and difficult to deal with with the patient.  After beginning in July, in  December, they started noticing that the patient was having abnormal  bleeding described as very light bleeding to spotting on and off for 3 weeks  consistently.  At that time, I discussed this with the physician, and it was  felt that this may be abnormal bleeding.  The patient was followed for some  time, continued to have irregular bleeding.  She was given Provera once a  day for one month in March which began bleeding, and the patient continued  to have irregular spotting and light bleeding throughout.  The patient has  been getting Depo every 3 months since the beginning.  However, has been  late on what appears to be more than one occasion.  Last menstrual period  was May 17, 2004.  Last Pap smear was approximately 7 years ago.   CURRENT MEDICATIONS:  1.  Zoloft 100 mg.  2.  Depakote ER 500 mg.  3.  Gabapentin 400 mg.   PAST MEDICAL HISTORY:  Epilepsy, moderate mental retardation, spastic  quadriplegia.   OBJECTIVE:  VITAL SIGNS:  Vital signs noted.  Stable, and within normal  limits.  GENERAL:  The patient is alert in no apparent distress.  No physical exam   performed.   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  The patient's pattern of  bleeding sounds very consistent with Depo-Provera as Depo is related to  definitely having irregular bleeding, and eventually, the patient will  likely not have menses at all, but in the interim, she will have irregular  bleeding.  This is explained to the family, and they understand.  They would  like to continue having the Depo in an effort to help the patient get rid of  her menses.  It was also explained to the patient and her family that  considering she has never had any sexual activity that it would be okay if  she did not have to have a Pap smear considering that the patient would need  to be sedated in order to have that done.  We will get a pelvic ultrasound  today to evaluate the anatomy, specifically the endometrial stripe, to  ensure there are not any other  problems.  The patient will continue to  follow up here every 3 months for Depo-Provera.      AD/MEDQ  D:  07/14/2004  T:  07/14/2004  Job:  [id number]

## 2010-07-15 NOTE — Discharge Summary (Signed)
[name], [name]            ACCOUNT NO.:  [id number]   MEDICAL RECORD NO.:  [id number]          PATIENT TYPE:  INP   LOCATION:  3011                         FACILITY:  [facility]   PHYSICIAN:  Ileana Roup, M.D.  DATE OF BIRTH:  1975/10/02   DATE OF ADMISSION:  07/19/2005  DATE OF DISCHARGE:  07/25/2005                                 DISCHARGE SUMMARY   DISCHARGE DIAGNOSES:  1.  Cellulitis of the right orbit/nasal bridge, left upper extremity, right      pretibial region, likely multifocal.  2.  Cerebral palsy.  3.  Seizure disorder.  4.  Normocytic anemia, likely anemia of chronic disease.  5.  Mild hemorrhagic cystitis.   DISCHARGE CONDITION:  Stable.   DISCHARGE MEDICATIONS:  1.  Baclofen 10 mg t.i.d.  2.  Protonix 40 mg daily.  3.  Senokot one tablet daily.  4.  Depakote 250 mg daily.  5.  Zoloft 100 mg daily.  6.  Os-Cal plus D one tablet b.i.d.  7.  Neurontin 1200 mg q.h.s.  8.  Depakote 1000 mg q.h.s.  9.  Levofloxacin 750 mg x7 more days.   CONSULTATIONS:  None.   PROCEDURES:  1.  CT maxillofacial and neck, Jul 20, 2005, demonstrating malformation and      cerebellar hypoplasia as well as hypo-attenuation in the left frontal      lobe compatible with remote ischemic infarction.  2.  No orbital cellulitis.  3.  No acute findings to the neck.  4.  CT of the abdomen and pelvis without contrast, which was negative, on      Jul 22, 2005.  5.  A PICC line was placed on Jul 20, 2005, and discontinue the day prior to      discharge.   DISPOSITION:  The patient will be discharged back to her group home.   HISTORY OF PRESENT ILLNESS:  [name] is a 35 year old African-American  woman with a past medical history significant for cerebral palsy and seizure  disorder who presented with a 4-5 day history of lethargy and nausea 2 days  prior to admission, with acute development of a large knot to the right  forehead which was not responsive to cold packs or  Benadryl.  She began to  have pain and swelling with erythema over this region, which also extended  across the nasal bridge.   REVIEW OF SYSTEMS:  Significant for fevers and chills with decreased  appetite.  She also had some swelling and erythema to the right lateral shin  with induration and __________, as well as the left lateral deltoid with 2 x  2 cm areas, all of which were demonstrating an infected  carbuncle/folliculitis.   ADMISSION PHYSICAL EXAMINATION:  VITAL SIGNS:  Temperature 102.2, blood  pressure 113/61, pulse 86, respirations 18.  GENERAL:  Only significant for severe tenderness and swelling with erythema  to the right forehead across the nasal bridge with warmth to the touch.  The  right lateral shin was noted to be 4 x 4 cm indurated lesion with no acute  drainage.  Also, the left lateral  deltoid region on the upper arm showed a 2  x 2 cm region demonstrating no drainage, but was erythematous and warm to  the touch.  NEUROLOGIC:  The patient had significant contractures secondary to her  cerebral palsy, particular in the bilateral lower extremities, as well as  decreased range of motion to her left upper extremity at the elbow, wrist  and fingers.  Her speech was consistent with cerebral palsy, but was  intelligible and coherent.   HOSPITAL COURSE:  1.  Cellulitis of the face, shin and arm.  The patient had blood cultures      drawn and was started on IV vancomycin and Zosyn therapy, but      unfortunately there was no obvious area which could be aspirated for      culture.  Blood cultures were obtained which were negative for growth.      Over the next few days, the patient responded remarkably to IV therapy,      and we felt that because of her marked improvement that we could switch      to an oral regimen.  Of note, the patient did have a few days thereafter      a mild case of hematuria noted to her Foley bag, which, on urinalysis,      demonstrated some  trace leukocyte esterase with 3-6 white blood cells      and too numerous to count red blood cells; however, Gram stain was      unremarkable.  There was, however, insignificant growth noted to that      particular culture.  As such, levofloxacin was chosen for oral regimen,      which would help cover both organisms.  She responded remarkable after      essentially 7 days of therapy and was advised to continue another 7 days      to complete a 14-day course.  2.  Cerebral palsy.  The patient was continued on her baclofen for muscle      spasms and gabapentin, as well as Depakote therapy, with no difficulties      noted during this admission.  In addition, she had physical therapy 3      times a week, which helped her remarkably with her stretching exercises      and other physical limitations.  3.  Hematuria.  As noted above, during this hospitalization, the patient had      a Foley catheter placed, and over the course of a couple of days there      was noted a trace amount of hematuria.  The first urinalysis on Jul 22, 2005 demonstrated trace leukocyte esterase with 3-6 white blood cells      and too numerous to count red blood cells, and by culture showed no      specific growth.  Urinalysis on admission, however, had been      unremarkable.  Because of this disparity, we felt that the patient may      have encountered some kind of hemorrhagic cystitis, and there was no      evidence of trauma on initial insertion of her Foley catheter.  By the      day of discharge, after removal of the Foley catheter, there was no      evidence of further hemorrhagic urination.  A CT of the abdomen and      pelvis was obtained to rule out any kind of  kidney stones, and it was      essentially unremarkable.  4.  Normocytic anemia.  Of note, the patient has had a long history of      normocytic anemia with no specific testing noted on the E-chart.  Iron     studies demonstrated an elevated ferritin  of 412 with serum iron of 12.      Vitamin B-12 was also elevated at 1308 with an RBC folate of 342.      Erythropoietin was drawn, which was normal at 28.3 million IU/mL.  Stool      for occult blood was obtained for 5 samples, only 1 of which was      positive.  Reticulocyte count was 0.6% when her hematocrit was 30.6%.      Likely, the patient may have a baseline normocytic anemia      from anemia of chronic disease.  Further testing to include LVH was      unremarkable for any evidence of hemolytic process.  At some point,      perhaps, the patient would benefit from bone marrow biopsy to further      quantify and evaluate this disorder.      Coralie Carpen, M.D.    ______________________________  Ileana Roup, M.D.    FR/MEDQ  D:  07/25/2005  T:  07/25/2005  Job:  [id number]   cc:   Alvin Critchley(?), M.D.  Outpatient Clinic

## 2010-08-27 ENCOUNTER — Ambulatory Visit (INDEPENDENT_AMBULATORY_CARE_PROVIDER_SITE_OTHER): Payer: Medicaid Other

## 2010-08-27 ENCOUNTER — Inpatient Hospital Stay (INDEPENDENT_AMBULATORY_CARE_PROVIDER_SITE_OTHER)
Admission: RE | Admit: 2010-08-27 | Discharge: 2010-08-27 | Disposition: A | Discharge status: Home / Self Care | Payer: Medicaid Other | Source: Ambulatory Visit | Attending: Family Medicine | Admitting: Family Medicine

## 2010-08-27 DIAGNOSIS — R6889 Other general symptoms and signs: Secondary | ICD-10-CM

## 2010-09-29 ENCOUNTER — Other Ambulatory Visit: Payer: Self-pay | Admitting: *Deleted

## 2010-09-29 MED ORDER — BACLOFEN 10 MG PO TABS: 10.0000 mg | ORAL_TABLET | Freq: Three times a day (TID) | ORAL | Status: DC

## 2011-01-12 ENCOUNTER — Encounter: Payer: Self-pay | Admitting: Internal Medicine

## 2011-01-12 DIAGNOSIS — F639 Impulse disorder, unspecified: Secondary | ICD-10-CM | POA: Insufficient documentation

## 2011-01-12 DIAGNOSIS — F71 Moderate intellectual disabilities: Secondary | ICD-10-CM | POA: Insufficient documentation

## 2011-04-17 ENCOUNTER — Ambulatory Visit (INDEPENDENT_AMBULATORY_CARE_PROVIDER_SITE_OTHER): Payer: Medicaid Other | Admitting: Internal Medicine

## 2011-04-17 ENCOUNTER — Encounter: Payer: Self-pay | Admitting: Internal Medicine

## 2011-04-17 VITALS — BP 117/68 | HR 73 | Temp 97.7°F | Wt 146.2 lb

## 2011-04-17 DIAGNOSIS — Z0000 Encounter for general adult medical examination without abnormal findings: Secondary | ICD-10-CM

## 2011-04-17 DIAGNOSIS — G40909 Epilepsy, unspecified, not intractable, without status epilepticus: Secondary | ICD-10-CM

## 2011-04-17 DIAGNOSIS — D638 Anemia in other chronic diseases classified elsewhere: Secondary | ICD-10-CM

## 2011-04-17 DIAGNOSIS — R05 Cough: Secondary | ICD-10-CM | POA: Insufficient documentation

## 2011-04-17 DIAGNOSIS — F329 Major depressive disorder, single episode, unspecified: Secondary | ICD-10-CM

## 2011-04-17 DIAGNOSIS — R569 Unspecified convulsions: Secondary | ICD-10-CM

## 2011-04-17 LAB — CBC
MCH: 27.6 pg (ref 26.0–34.0)
MCV: 85.1 fL (ref 78.0–100.0)
RBC: 4.56 MIL/uL (ref 3.87–5.11)
RDW: 13.1 % (ref 11.5–15.5)
WBC: 7.1 10*3/uL (ref 4.0–10.5)

## 2011-04-17 LAB — COMPREHENSIVE METABOLIC PANEL
ALT: <8 U/L (ref 0–35)
AST: 19 U/L (ref 0–37)
BUN: 9 mg/dL (ref 6–23)
CO2: 26 mEq/L (ref 19–32)
Calcium: 10.1 mg/dL (ref 8.4–10.5)
Chloride: 104 mEq/L (ref 96–112)
Creat: 0.41 mg/dL — ABNORMAL LOW (ref 0.50–1.10)
Glucose, Bld: 74 mg/dL (ref 70–99)
Potassium: 4.3 mEq/L (ref 3.5–5.3)
Sodium: 142 mEq/L (ref 135–145)
Total Protein: 7.1 g/dL (ref 6.0–8.3)

## 2011-04-17 LAB — LIPID PANEL
Cholesterol: 203 mg/dL — ABNORMAL HIGH (ref 0–200)
HDL: 39 mg/dL — ABNORMAL LOW (ref >39)
LDL Cholesterol: 140 mg/dL — ABNORMAL HIGH (ref 0–99)
VLDL: 24 mg/dL (ref 0–40)

## 2011-04-17 MED ORDER — BENZONATATE 100 MG PO CAPS: 100.0000 mg | ORAL_CAPSULE | Freq: Four times a day (QID) | ORAL | Status: DC | PRN

## 2011-04-17 NOTE — Assessment & Plan Note (Signed)
Assessment: Status:  Has not had seizures in several years according to [name] and caregiver. Last dilantin level checked in 2011.  Disease Control: controlled  Progress toward goals: at goal  Barriers to meeting goals: no barriers identified   Plan:      continue current medications  Check Valproic acid levels today.

## 2011-04-17 NOTE — Assessment & Plan Note (Addendum)
   Defer flu shot to next visit when not acutely ill.  Check CMET, fasting lipid panel.
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
## 2011-04-17 NOTE — Assessment & Plan Note (Signed)
Likely secondary to acute URI given acuity of symptoms and that she lives in a group home. There is no evidence of pneumonia with clear lung exam and lack of fevers. May also be contributed by her allergic rhinitis (although less likely given she is currently already on treatment and notes stability of her nasal congestion, etc), versus GERD (given hx but no current active treatment - she herself does not recall even having this diagnosis).  Tx with antitussive PRN  Pt instructed to return in 4-5 weeks if persistent cough, at which time can consider trial of PPI or adjustment of allergic rhinitis regimen as indicated.  She is to call if developing fevers, chills, worsening symptoms.

## 2011-04-17 NOTE — Assessment & Plan Note (Signed)
CBC:    Component Value Date/Time   WBC 8.2 11/17/2009 2307   HGB 11.9* 11/17/2009 2307   HCT 34.5* 11/17/2009 2307   PLT 282 11/17/2009 2307   MCV 81.8 11/17/2009 2307   NEUTROABS 3.3 11/17/2009 2307   LYMPHSABS 4.1* 11/17/2009 2307   MONOABS 0.5 11/17/2009 2307   EOSABS 0.3 11/17/2009 2307   BASOSABS 0.1 11/17/2009 2307    Assessment: Status: patient does still menstruate,  Although less blood flow as compared to prior.   Plan:      Last cbc was 10/2009. Will recheck today.  Check anemia panel today.

## 2011-04-17 NOTE — Patient Instructions (Signed)
   Please follow-up at the clinic in 6 months - OR, please follow-up in the clinic sooner if needed (especially if the cough isn't going away in the next 4-5 weeks)  There have been changes in your medications:  START tessalon perles as needed for your cough.    If you have been started on new medication(s), and you develop symptoms concerning for allergic reaction, including, but not limited to, throat closing, tongue swelling, rash, please stop the medication immediately and call the clinic at [phone], and go to the ER.  If symptoms worsen, or new symptoms arise, please call the clinic or go to the ER.  Please bring all of your medications in a bag to your next visit.

## 2011-04-17 NOTE — Assessment & Plan Note (Signed)
Pt is currently taking Sertraline and is controlled on this medication.   Plan:  Continue current regimen.

## 2011-04-18 LAB — VALPROIC ACID LEVEL: Valproic Acid Lvl: 92 ug/mL (ref 50.0–100.0)

## 2011-05-01 ENCOUNTER — Ambulatory Visit (INDEPENDENT_AMBULATORY_CARE_PROVIDER_SITE_OTHER): Payer: Medicaid Other | Admitting: Obstetrics and Gynecology

## 2011-05-01 ENCOUNTER — Ambulatory Visit: Payer: Self-pay

## 2011-05-01 DIAGNOSIS — Z01419 Encounter for gynecological examination (general) (routine) without abnormal findings: Secondary | ICD-10-CM

## 2011-06-20 ENCOUNTER — Telehealth: Payer: Self-pay | Admitting: *Deleted

## 2011-06-20 ENCOUNTER — Ambulatory Visit (INDEPENDENT_AMBULATORY_CARE_PROVIDER_SITE_OTHER): Payer: Medicaid Other | Admitting: Internal Medicine

## 2011-06-20 VITALS — BP 103/59 | HR 72 | Temp 97.0°F | Ht 65.0 in | Wt 150.0 lb

## 2011-06-20 DIAGNOSIS — L299 Pruritus, unspecified: Secondary | ICD-10-CM

## 2011-06-20 DIAGNOSIS — L209 Atopic dermatitis, unspecified: Secondary | ICD-10-CM | POA: Insufficient documentation

## 2011-06-20 MED ORDER — TRIAMCINOLONE ACETONIDE 0.5 % EX CREA: TOPICAL_CREAM | Freq: Three times a day (TID) | CUTANEOUS | Status: DC

## 2011-06-20 NOTE — Progress Notes (Signed)
Patient ID: [name], female   DOB: [birth year], 36 y.o.   MRN: [id number]  36 year old woman with past medical history listed below comes to clinic complaining of itching in her legs mostly and also her left arm for past one month She had some itching before as well but this is much worse than usual. She does not remember any specific triggers that may have caused it but he only thing that has changed recently his head there is a new dog at group home that she lives in No change in medication recently No rashes, hives, sneezing, rhinorrhea or any other complaints Uses Benadryl for itching which works well  Physical exam   General Appearance:     Filed Vitals:   06/20/11 1452 06/20/11 1500  BP: 103/59   Pulse: 72   Temp: 97 F (36.1 C)   TempSrc: Oral   Height:  5\' 5"  (1.651 m)  Weight:  150 lb (68.04 kg)  SpO2: 100%      Alert, cooperative, no distress, appears stated age  Head:    Normocephalic, without obvious abnormality, atraumatic  Eyes:    PERRL, conjunctiva/corneas clear, EOM's intact, fundi    benign, both eyes       Neck:   Supple, symmetrical, trachea midline, no adenopathy;       thyroid:  No enlargement/tenderness/nodules; no carotid   bruit or JVD  Lungs:     Clear to auscultation bilaterally, respirations unlabored  Chest wall:    No tenderness or deformity  Heart:    Regular rate and rhythm, S1 and S2 normal, no murmur, rub   or gallop  Abdomen:     Soft, non-tender, bowel sounds active all four quadrants,    no masses, no organomegaly  Extremities:   dry skin and scratch marks on both her lower extremities as well as arms , bruises from trauma on her shin at 2 places-it is healing well    Pulses:   2+ and symmetric all extremities  Skin:   Skin color, texture, turgor normal, no rashes or lesions  Neurologic:  nonfocal grossly  Review of system - as per history of present illness

## 2011-06-20 NOTE — Assessment & Plan Note (Signed)
Differentials include allergy to the dog, dry skin, medication, anxiety, environmental She had chemistry checked last month which was normal  Plan - Continue Benadryl - Prescribe steroid topical cream - Stay away from the dog - Zyrtec for systemic allergy - Petroleum jelly or other moisturizing cream every day - Followup in one month if no improvement

## 2011-06-20 NOTE — Telephone Encounter (Signed)
Kelly called  and stated pt left foot is swollen and area on left leg has dry skin and red. Has had problems in past. Appt 06/20/11 2:45PM Dr Scot Dock. Stanton Kidney Gregg Holster RN 06/20/11 10AM

## 2011-06-20 NOTE — Patient Instructions (Signed)
Itching  Itching is a symptom that can be caused by many things. These include skin problems (including infections) as well as some internal diseases.   If the itching is affecting just one area of the body, it is most likely due to a common skin problem, such as:   Poison oak and poison ivy.   Contact dermatitis (skin irritation from a plant, chemicals, fiberglass, detergents, new cosmetic, new jewelry, or other substance).   Fungus (such as athlete's foot, jock itch, or ringworm).   Head lice   Dandruff   Insect bite   Infection (such as Shingles or other virus infections).  If the itching is all over (widespread), the possible causes are many. These include:    Dry skin or eczema   Heat rash   Hives   Liver disorders   Kidney disorders  TREATMENT   Localized itching    Lubrication of the skin. Use an ointment or cream or other unperfumed moisturizers if the skin is dry. Apply frequently, especially after bathing.   Anti-itch medicines. These medications may help control the urge to scratch. Scratching always makes itching worse and increases the chance of getting an infection.   Cortisone creams and ointments. These help reduce the inflammation.   Antibiotics. Skin infections can cause itching. Topical or oral antibiotics may be needed for 10 to 20 days to get rid of an infection.  If you can identify what caused the itching, avoid this substance in the future.   Widespread itching   The following measures may help to relieve itching regardless of the cause:    Wash the skin once with soap to remove irritants.   Bathe in tepid water with baking soda, cornstarch, or oatmeal.   Use calamine lotion (nonprescription) or a baking soda solution (1 teaspoon in 4 ounces of water on the skin).   Apply 1% hydrocortisone cream (no prescription needed). Do not use this if there might be a skin infection.   Avoid scratching.   Avoid itchy or tight-fitting clothes.   Avoid excessive heat, sweating,  scented soaps, and swimming pools.   The lubricants, anti-itch medicines, etc. noted above may be helpful for controlling symptoms.  SEEK MEDICAL CARE IF:    The itching becomes severe.   Your itch is not better after 1 week of treatment. Contact your caregiver to schedule further evaluation.  Document Released: 02/13/2005 Document Revised: 02/02/2011 Document Reviewed: 08/03/2006  [employer] Patient Information 2012 [employer].

## 2011-07-13 ENCOUNTER — Ambulatory Visit (INDEPENDENT_AMBULATORY_CARE_PROVIDER_SITE_OTHER): Payer: Medicaid Other | Admitting: Internal Medicine

## 2011-07-13 ENCOUNTER — Encounter: Payer: Self-pay | Admitting: Internal Medicine

## 2011-07-13 VITALS — BP 112/63 | HR 74 | Temp 97.8°F | Ht 65.0 in | Wt 149.9 lb

## 2011-07-13 DIAGNOSIS — F329 Major depressive disorder, single episode, unspecified: Secondary | ICD-10-CM

## 2011-07-13 DIAGNOSIS — R569 Unspecified convulsions: Secondary | ICD-10-CM

## 2011-07-13 DIAGNOSIS — E785 Hyperlipidemia, unspecified: Secondary | ICD-10-CM

## 2011-07-13 DIAGNOSIS — L209 Atopic dermatitis, unspecified: Secondary | ICD-10-CM

## 2011-07-13 DIAGNOSIS — L2089 Other atopic dermatitis: Secondary | ICD-10-CM

## 2011-07-13 MED ORDER — HYDROCORTISONE 1 % EX CREA: TOPICAL_CREAM | CUTANEOUS | Status: AC
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
## 2011-07-13 NOTE — Patient Instructions (Signed)
   Please follow-up at the clinic in 6 months, at which time we will reevaluate your blood pressure, cholesterol - OR, please follow-up in the clinic sooner if needed.  There have been changes in your medications:  STOP triamcinolone cream  START hydrocortisone 1% cream up to 2 times a day as needed for itching, rash.   If you have been started on new medication(s), and you develop symptoms concerning for allergic reaction, including, but not limited to, throat closing, tongue swelling, rash, please stop the medication immediately and call the clinic at [phone], and go to the ER.  If symptoms worsen, or new symptoms arise, please call the clinic or go to the ER.  Please bring all of your medications in a bag to your next visit.

## 2011-07-25 ENCOUNTER — Encounter: Payer: Self-pay | Admitting: Internal Medicine

## 2011-07-25 DIAGNOSIS — E785 Hyperlipidemia, unspecified: Secondary | ICD-10-CM

## 2011-07-25 HISTORY — DX: Hyperlipidemia, unspecified: E78.5

## 2011-07-25 NOTE — Assessment & Plan Note (Signed)
Pertinent Labs: Liver Function Tests:    Component Value Date/Time   AST 19 04/17/2011 1118   ALT <8 04/17/2011 1118   ALKPHOS 37* 04/17/2011 1118   BILITOT 0.1* 04/17/2011 1118   PROT 7.1 04/17/2011 1118   ALBUMIN 4.4 04/17/2011 1118    Lipid Panel:     Component Value Date/Time   CHOL 203* 04/17/2011 1118   TRIG 122 04/17/2011 1118   HDL 39* 04/17/2011 1118   CHOLHDL 5.2 04/17/2011 1118   VLDL 24 04/17/2011 1118   LDLCALC 140* 04/17/2011 1118     Assessment: Disease Control: controlled  Progress toward goals: at goal  Barriers to meeting goals: no barriers identified    Patient is not currently on medication because although her LDL is high, her goal LDL is < 160 per ATP III guidelines. She is already at goal at present, off of therapy, but we will need continue to monitor and adjust as her comorbidites change.    Plan:  lifestyle modification recommended  Will recheck in 03/2012 and will add therapy, adjust goal as indicated at that time.

## 2011-07-25 NOTE — Assessment & Plan Note (Signed)
Assessment: Rash is improving significantly, now just hyperpigmented healing scars. Symptoms of pruritis are also improving. She is continuing to take the triamcinolone TID because that is how the medication was scheduled, but NOT because she feels like she needs it that frequently.  Plan:      Will DC triamcinolone - do not want her to continue long term if not needed because this could lead to thinning of the skin.  Will start hydrocortisone cream BID PRN.

## 2011-07-25 NOTE — Assessment & Plan Note (Signed)
Assessment:  Pt is currently taking Sertraline and is controlled on this medication. Denies SI/HI. Other potential contributing factors towards her symptoms include: none.  Plan:  Continue current regimen.

## 2012-03-25 ENCOUNTER — Telehealth: Payer: Self-pay | Admitting: Obstetrics and Gynecology

## 2012-03-26 ENCOUNTER — Ambulatory Visit: Payer: Medicaid Other | Admitting: Obstetrics and Gynecology

## 2012-03-26 ENCOUNTER — Encounter: Payer: Self-pay | Admitting: Obstetrics and Gynecology

## 2012-03-26 VITALS — BP 104/62 | Temp 99.1°F

## 2012-03-26 DIAGNOSIS — N926 Irregular menstruation, unspecified: Secondary | ICD-10-CM

## 2012-03-26 DIAGNOSIS — R102 Pelvic and perineal pain: Secondary | ICD-10-CM

## 2012-03-26 DIAGNOSIS — N63 Unspecified lump in unspecified breast: Secondary | ICD-10-CM

## 2012-03-26 LAB — POCT URINALYSIS DIPSTICK
Bilirubin, UA: NEGATIVE
Glucose, UA: NEGATIVE
Ketones, UA: NEGATIVE

## 2012-03-26 LAB — CBC
MCHC: 33.8 g/dL (ref 30.0–36.0)
Platelets: 276 10*3/uL (ref 150–400)
RDW: 14.2 % (ref 11.5–15.5)
WBC: 6.1 10*3/uL (ref 4.0–10.5)

## 2012-03-26 LAB — POCT URINE PREGNANCY: Preg Test, Ur: NEGATIVE

## 2012-03-26 NOTE — Progress Notes (Addendum)
36 YO (wheelchair bound) with Mirena insertion 05/05/2009) complains of irregular bleeding and pelvic pain.  Patient has an intense disdain for menstrual periods and would not like to bleed at all.  Bleeding will last 5 days-3 weeks with change of protection (heaviest days) 4 times a day and has soiled her clothes.  Pain has been  in her back,  and to a lesser extent her pelvis,  for the past several months-off and on.  It  is worse when patient reclines.  Has talked to her guardian about having surgery to stop her bleeding-hysterectomy and that is what she wants..  Past week noticed a tender lump under left breast without drainage and has never had this before. Accompanied by caregiver:Lavera Patient denies any bowel/bladder function issues, vaginitis symptoms, fever, nipple discharge, other breast concerns or complaints.  O: Left Breast/Chest wall with 3 cm x 1 cm firm, slightly mobile ? cystic mass beneath left breast that is mildly tender      Abdomen: soft without tenderness      Pelvic: EGBUS-wnl, vagina-normal, cervix-string visible, uterus-difficult to assess due to patient anxiety but appears normal      adnexae-no masses or tenderness  U/A-negative except trace-leukocytes and 1+ blood (catherized) UPT-negative  A: Irregular Bleeding     Pelvic Pain     Mirena IUD (inserted 05/05/2009)     Left Breast/Chest Mass     Cerebral Palsy-wheelchair bound  P:  TSH, CBC, full bladder pelvic ultrasound for IUD location and pelvic evaluatiion and left breast/chest ultrasound        Urine for culture-pending        RTO-F/U with Dr. Pennie Rushing to discuss hysterectomy, per patient request after ultrasound  Thurlow Gallaga, PA-C

## 2012-03-28 LAB — URINE CULTURE: Organism ID, Bacteria: NO GROWTH

## 2012-04-03 ENCOUNTER — Other Ambulatory Visit: Payer: Medicaid Other

## 2012-04-03 ENCOUNTER — Encounter: Payer: Medicaid Other | Admitting: Obstetrics and Gynecology

## 2012-04-04 ENCOUNTER — Ambulatory Visit: Payer: Medicaid Other

## 2012-04-04 ENCOUNTER — Encounter: Payer: Self-pay | Admitting: Obstetrics and Gynecology

## 2012-04-04 ENCOUNTER — Ambulatory Visit: Payer: Medicaid Other | Admitting: Obstetrics and Gynecology

## 2012-04-04 VITALS — BP 118/74 | HR 80

## 2012-04-04 DIAGNOSIS — N6320 Unspecified lump in the left breast, unspecified quadrant: Secondary | ICD-10-CM

## 2012-04-04 DIAGNOSIS — R102 Pelvic and perineal pain: Secondary | ICD-10-CM

## 2012-04-04 DIAGNOSIS — N926 Irregular menstruation, unspecified: Secondary | ICD-10-CM

## 2012-04-04 MED ORDER — ESTRADIOL 1 MG PO TABS: 1.0000 mg | ORAL_TABLET | Freq: Every day | ORAL | Status: DC

## 2012-04-04 NOTE — Progress Notes (Signed)
37 YO seen recently with irregular bleeing (has Mirena) and left breast/chest mass (evaluation has not been scheduled yet).  Patient's TSH and CBC were normal.  Here to day  for ultrasound.  Accompanied by Caryl Asp Weekly, Group Home Supervisor.  O: U/S: uterus 5.51 x 4.40 x 4.61 cm,  endometrium 4.37 mm;  IUD seen in place on 3D rendering;  right ovary-3.40 x 2.05 x 1.79 cm and left ovary-3.6 x 2.03 x 2.46 cm.  A: Irregular Bleeding     Mirena IUD in place   P: To be given Estradiol 1 mg # 21 1 po qd no refills      Resubmitted order to have left breast imaging evaluation      Patient to call if symptoms persist and is to be scheduled with     Dr. Pennie Rushing, accompanied by her guardian is she decides she      still wants to pursue hysterectomy      RTO-as scheduled  Ahmere Hemenway, PA-C

## 2012-04-17 ENCOUNTER — Ambulatory Visit
Admission: RE | Admit: 2012-04-17 | Discharge: 2012-04-17 | Disposition: A | Discharge status: Home / Self Care | Payer: Medicaid Other | Source: Ambulatory Visit | Attending: Obstetrics and Gynecology | Admitting: Obstetrics and Gynecology

## 2012-04-17 DIAGNOSIS — N6320 Unspecified lump in the left breast, unspecified quadrant: Secondary | ICD-10-CM

## 2012-06-17 ENCOUNTER — Encounter: Payer: Self-pay | Admitting: Licensed Clinical Social Worker

## 2012-06-17 NOTE — Progress Notes (Signed)
Patient ID: [name], female   DOB: Sep 04, 1975, 37 y.o.   MRN: [id number] CSW received SCAT Part B request from [facility] on behalf of [name].  CSW informed [facility] program supervisor, pt has not been seen since 2013.  Pt has an appt on 07/04/2012, Part B can be completed during that visit.  CSW forwarded form to PCP.

## 2012-07-02 ENCOUNTER — Inpatient Hospital Stay (HOSPITAL_COMMUNITY)
Admission: AD | Admit: 2012-07-02 | Discharge: 2012-07-02 | Disposition: A | Discharge status: Home / Self Care | Payer: Medicaid Other | Source: Ambulatory Visit | Attending: Obstetrics and Gynecology | Admitting: Obstetrics and Gynecology

## 2012-07-04 ENCOUNTER — Ambulatory Visit (INDEPENDENT_AMBULATORY_CARE_PROVIDER_SITE_OTHER): Payer: Medicaid Other | Admitting: Internal Medicine

## 2012-07-04 ENCOUNTER — Encounter: Payer: Self-pay | Admitting: Internal Medicine

## 2012-07-04 ENCOUNTER — Other Ambulatory Visit: Payer: Self-pay | Admitting: *Deleted

## 2012-07-04 VITALS — BP 110/64 | HR 72 | Temp 98.3°F

## 2012-07-04 DIAGNOSIS — D638 Anemia in other chronic diseases classified elsewhere: Secondary | ICD-10-CM

## 2012-07-04 DIAGNOSIS — J309 Allergic rhinitis, unspecified: Secondary | ICD-10-CM

## 2012-07-04 DIAGNOSIS — E785 Hyperlipidemia, unspecified: Secondary | ICD-10-CM

## 2012-07-04 DIAGNOSIS — G8250 Quadriplegia, unspecified: Secondary | ICD-10-CM

## 2012-07-04 DIAGNOSIS — J302 Other seasonal allergic rhinitis: Secondary | ICD-10-CM

## 2012-07-04 DIAGNOSIS — R569 Unspecified convulsions: Secondary | ICD-10-CM

## 2012-07-04 LAB — CBC
MCV: 83.9 fL (ref 78.0–100.0)
Platelets: 297 10*3/uL (ref 150–400)
RBC: 4.4 MIL/uL (ref 3.87–5.11)
RDW: 13.7 % (ref 11.5–15.5)
WBC: 6.6 10*3/uL (ref 4.0–10.5)

## 2012-07-04 MED ORDER — FLUTICASONE PROPIONATE 50 MCG/ACT NA SUSP: 2.0000 | Freq: Every day | NASAL | Status: AC

## 2012-07-04 NOTE — Assessment & Plan Note (Signed)
Assessment: Patient's s/s consistent with seasonal allergies, she is already on an appropriate regimen.  Plan:      Continue Zyrtec and flonase.

## 2012-07-04 NOTE — Assessment & Plan Note (Signed)
Pertinent Data Reviewed:  Valproic acid level (12/2011) - labs obtained at nursing facility - 109.6  Valproic acid level (03/2012) - labs obtained at nursing facility - 87.4   Assessment: Patient has no indication of toxicity, and last measured level was within therapeutic range. She has been on a stable regimen of her valproic acid for years without breakthrough seizures. I therefore, will not adjust her regimen today,  Plan:      Continue valproic acid at current dosage.  Consider to check level at followup if clinical indication.

## 2012-07-04 NOTE — Telephone Encounter (Signed)
Pt has an appt today.  

## 2012-07-04 NOTE — Patient Instructions (Signed)
General Instructions:  Please follow-up at the clinic in 6 months, at which time we will reevaluate your blood pressure, chronic medical issues - OR, please follow-up in the clinic sooner if needed.  There have not been changes in your medications.   You are getting labs today, if they are abnormal I will give you a call.   If symptoms worsen, or new symptoms arise, please call the clinic or go to the ER.  PLEASE BRING ALL OF YOUR MEDICATIONS  IN A BAG TO YOUR NEXT APPOINTMENT
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
## 2012-07-04 NOTE — Assessment & Plan Note (Signed)
Pertinent Labs: Liver Function Tests:    Component Value Date/Time   AST 19 04/17/2011 1118   ALT <8 04/17/2011 1118   ALKPHOS 37* 04/17/2011 1118   BILITOT 0.1* 04/17/2011 1118   PROT 7.1 04/17/2011 1118   ALBUMIN 4.4 04/17/2011 1118    Lipid Panel:     Component Value Date/Time   CHOL 203* 04/17/2011 1118   TRIG 122 04/17/2011 1118   HDL 39* 04/17/2011 1118   CHOLHDL 5.2 04/17/2011 1118   VLDL 24 04/17/2011 1118   LDLCALC 140* 04/17/2011 1118     Assessment: Disease Control: controlled  Progress toward goals: at goal  Barriers to meeting goals: no barriers identified    Patient is not currently on medication because although her LDL is high, her goal LDL is < 160 per ATP III guidelines. She is already at goal at present, off of therapy, but we will need continue to monitor and adjust as her comorbidites change.    Plan:  lifestyle modification recommended  Recheck lipid panel

## 2012-07-05 LAB — COMPREHENSIVE METABOLIC PANEL
ALT: <8 U/L (ref 0–35)
Albumin: 4.2 g/dL (ref 3.5–5.2)
CO2: 28 mEq/L (ref 19–32)
Calcium: 9.3 mg/dL (ref 8.4–10.5)
Chloride: 103 mEq/L (ref 96–112)
Glucose, Bld: 97 mg/dL (ref 70–99)
Potassium: 3.9 mEq/L (ref 3.5–5.3)
Sodium: 140 mEq/L (ref 135–145)
Total Protein: 7.2 g/dL (ref 6.0–8.3)

## 2012-07-05 LAB — LIPID PANEL: LDL Cholesterol: 113 mg/dL — ABNORMAL HIGH (ref 0–99)

## 2012-08-06 ENCOUNTER — Other Ambulatory Visit: Payer: Self-pay | Admitting: *Deleted

## 2012-08-06 MED ORDER — BACLOFEN 10 MG PO TABS: 10.0000 mg | ORAL_TABLET | Freq: Three times a day (TID) | ORAL | Status: DC

## 2012-08-06 MED ORDER — ACETAMINOPHEN ER 650 MG PO TBCR: 650.0000 mg | EXTENDED_RELEASE_TABLET | Freq: Three times a day (TID) | ORAL | Status: DC | PRN

## 2012-09-05 ENCOUNTER — Ambulatory Visit (INDEPENDENT_AMBULATORY_CARE_PROVIDER_SITE_OTHER): Payer: Medicaid Other | Admitting: Internal Medicine

## 2012-09-05 ENCOUNTER — Encounter: Payer: Self-pay | Admitting: Internal Medicine

## 2012-09-05 VITALS — BP 101/68 | HR 67 | Temp 97.3°F | Ht 64.0 in | Wt 146.4 lb

## 2012-09-05 DIAGNOSIS — L0291 Cutaneous abscess, unspecified: Secondary | ICD-10-CM | POA: Insufficient documentation

## 2012-09-05 MED ORDER — SULFAMETHOXAZOLE-TMP DS 800-160 MG PO TABS: 1.0000 | ORAL_TABLET | Freq: Two times a day (BID) | ORAL | Status: DC

## 2012-09-05 NOTE — Patient Instructions (Addendum)
Please follow up in 2 weeks to make sure this is resolving  Take your antibiotics and pain medication as prescribed (Bactrim 2x per day for 7 days and Tylenol 650 mg every 8 hours as needed Clean the wound daily, apply gauze and paper tape to dress. If the dressing is soiled with blood change more frequently than daily.  Incision and Drainage Incision and drainage is a procedure in which a sac-like structure (cystic structure) is opened and drained. The area to be drained usually contains material such as pus, fluid, or blood.  LET YOUR CAREGIVER KNOW ABOUT:   Allergies to medicine.  Medicines taken, including vitamins, herbs, eyedrops, over-the-counter medicines, and creams.  Use of steroids (by mouth or creams).  Previous problems with anesthetics or numbing medicines.  History of bleeding problems or blood clots.  Previous surgery.  Other health problems, including diabetes and kidney problems.  Possibility of pregnancy, if this applies. RISKS AND COMPLICATIONS  Pain.  Bleeding.  Scarring.  Infection. BEFORE THE PROCEDURE  You may need to have an ultrasound or other imaging tests to see how large or deep your cystic structure is. Blood tests may also be used to determine if you have an infection or how severe the infection is. You may need to have a tetanus shot. PROCEDURE  The affected area is cleaned with a cleaning fluid. The cyst area will then be numbed with a medicine (local anesthetic). A small incision will be made in the cystic structure. A syringe or catheter may be used to drain the contents of the cystic structure, or the contents may be squeezed out. The area will then be flushed with a cleansing solution. After cleansing the area, it is often gently packed with a gauze or another wound dressing. Once it is packed, it will be covered with gauze and tape or some other type of wound dressing. AFTER THE PROCEDURE   Often, you will be allowed to go home right after  the procedure.  You may be given antibiotic medicine to prevent or heal an infection.  If the area was packed with gauze or some other wound dressing, you will likely need to come back in 1 to 2 days to get it removed.  The area should heal in about 14 days. Document Released: 08/09/2000 Document Revised: 08/15/2011 Document Reviewed: 04/10/2011 [facility] Patient Information 2014 [place], [place].  Abscess An abscess is an infected area that contains a collection of pus and debris.It can occur in almost any part of the body. An abscess is also known as a furuncle or boil. CAUSES  An abscess occurs when tissue gets infected. This can occur from blockage of oil or sweat glands, infection of hair follicles, or a minor injury to the skin. As the body tries to fight the infection, pus collects in the area and creates pressure under the skin. This pressure causes pain. People with weakened immune systems have difficulty fighting infections and get certain abscesses more often.  SYMPTOMS Usually an abscess develops on the skin and becomes a painful mass that is red, warm, and tender. If the abscess forms under the skin, you may feel a moveable soft area under the skin. Some abscesses break open (rupture) on their own, but most will continue to get worse without care. The infection can spread deeper into the body and eventually into the bloodstream, causing you to feel ill.  DIAGNOSIS  Your caregiver will take your medical history and perform a physical exam. A sample of  fluid may also be taken from the abscess to determine what is causing your infection. TREATMENT  Your caregiver may prescribe antibiotic medicines to fight the infection. However, taking antibiotics alone usually does not cure an abscess. Your caregiver may need to make a small cut (incision) in the abscess to drain the pus. In some cases, gauze is packed into the abscess to reduce pain and to continue draining the area. HOME CARE  INSTRUCTIONS   Only take over-the-counter or prescription medicines for pain, discomfort, or fever as directed by your caregiver.  If you were prescribed antibiotics, take them as directed. Finish them even if you start to feel better.  If gauze is used, follow your caregiver's directions for changing the gauze.  To avoid spreading the infection:  Keep your draining abscess covered with a bandage.  Wash your hands well.  Do not share personal care items, towels, or whirlpools with others.  Avoid skin contact with others.  Keep your skin and clothes clean around the abscess.  Keep all follow-up appointments as directed by your caregiver. SEEK MEDICAL CARE IF:   You have increased pain, swelling, redness, fluid drainage, or bleeding.  You have muscle aches, chills, or a general ill feeling.  You have a fever. MAKE SURE YOU:   Understand these instructions.  Will watch your condition.  Will get help right away if you are not doing well or get worse. Document Released: 11/23/2004 Document Revised: 08/15/2011 Document Reviewed: 04/28/2011 [facility] Patient Information 2014 [place], [place].

## 2012-09-05 NOTE — Progress Notes (Signed)
  Subjective:    Patient ID: [name], female    DOB: 04-25-1975, 37 y.o.   MRN: [id number]  HPI Comments: 37 y.o with cerebal palsy presents with left inframammary cyst associated with pain. Cyst has been present at least 1 week.  Pain hurts more than a little.  Bra makes the pain worse as it rubs the lesion.  Nothing has helped with lesion so far and it is draining yellow pus.  She denies fever/chills.       Review of Systems  Constitutional: Negative for fever and chills.       Objective:   Physical Exam  Nursing note and vitals reviewed. Constitutional: She is oriented to person, place, and time. Vital signs are normal. She appears well-developed and well-nourished. She is cooperative.  HENT:  Head: Normocephalic and atraumatic.  Pulmonary/Chest:    Neurological: She is alert and oriented to person, place, and time.  Skin: Skin is warm and dry.  Skin findings as noted   Psychiatric: She has a normal mood and affect. Her speech is normal and behavior is normal. Judgment and thought content normal. Cognition and memory are normal.  Moderate MR baseline CP          Assessment & Plan:  Please follow up in 2 weeks to make sure this is resolving  Take your antibiotics and pain medication as prescribed (Bactrim 2x per day for 7 days and Tylenol 650 mg every 8 hours as needed Clean the wound daily, apply gauze and paper tape to dress. If the dressing is soiled with blood change more frequently than daily.

## 2012-09-05 NOTE — Assessment & Plan Note (Signed)
Noted left inframammary region with significant yellow and serosanguinous pus Area was I&D'ed   Details of the Procedure    After reviewing the risks and benefits, the patient was deemed in satisfactory condition to undergo the procedure. Local anesthesia with Lidocaine 1.5 cc.  Was applied to left inframammary area after the area was cleaned with 3 swabs of betadine. 11 blade used to knick the affected area. Wound culture obtained.  Guaze and paper tape applied to area after visit.    follow up in 2 weeks to make sure this is resolving  Take your antibiotics and pain medication as prescribed (Bactrim 2x per day for 7 days and Tylenol 650 mg every 8 hours as needed Clean the wound daily, apply gauze and paper tape to dress. If the dressing is soiled with blood change more frequently than daily. Given information about abscess and I&D

## 2012-09-06 NOTE — Progress Notes (Signed)
Case discussed with Dr. McLean at the time of the visit.  We reviewed the resident's history and exam and pertinent patient test results.  I agree with the assessment, diagnosis, and plan of care documented in the resident's note.     

## 2012-09-08 LAB — WOUND CULTURE
Gram Stain: NONE SEEN
Gram Stain: NONE SEEN

## 2012-09-20 ENCOUNTER — Ambulatory Visit (INDEPENDENT_AMBULATORY_CARE_PROVIDER_SITE_OTHER): Payer: Medicaid Other | Admitting: Internal Medicine

## 2012-09-20 ENCOUNTER — Encounter: Payer: Self-pay | Admitting: Internal Medicine

## 2012-09-20 ENCOUNTER — Ambulatory Visit: Payer: Medicaid Other | Admitting: Internal Medicine

## 2012-09-20 VITALS — BP 104/59 | HR 54 | Temp 97.0°F | Ht 64.0 in | Wt 145.8 lb

## 2012-09-20 DIAGNOSIS — N898 Other specified noninflammatory disorders of vagina: Secondary | ICD-10-CM | POA: Insufficient documentation

## 2012-09-20 DIAGNOSIS — E785 Hyperlipidemia, unspecified: Secondary | ICD-10-CM

## 2012-09-20 DIAGNOSIS — L0291 Cutaneous abscess, unspecified: Secondary | ICD-10-CM

## 2012-09-20 DIAGNOSIS — R269 Unspecified abnormalities of gait and mobility: Secondary | ICD-10-CM | POA: Insufficient documentation

## 2012-09-20 NOTE — Progress Notes (Signed)
  Subjective:    Patient ID: [name], female    DOB: December 06, 1975, 37 y.o.   MRN: [id number]  HPI Comments: 37 y.o PMH HLD (ldl 113 TG 199 06/2012), cerebral palsy, GERD, quadriplegia, seizure disorder, impulse control disorder, moderate mental retardation, seasonal allergies.  She presents for follow up for abscess under left breast growing Morganella Morganii which was I&D'ed at last visit and she was given a course of Bactrim which she completed. She states the lesion is resolved and she is not having pain in the area.  BP is 104/59 HR 54 afebrile 100% 145 lbs, 5'4"  She has other boils in her private area which come and go but are not painful of draining like the one previously near her left breast.   She had a fall on Tuesday when she hit her left foot 3rd and 4th toes.  She has mild pain in the area. She is here with one of her assistants who states she falls from time to time when she tries to move too fast and she falls out of bed trying to change positions sometimes.       Review of Systems  Constitutional: Negative for fever and chills.  Respiratory: Negative for shortness of breath.   Cardiovascular: Negative for chest pain.  Neurological:       +falls        Objective:   Physical Exam  Nursing note and vitals reviewed. Constitutional: She is oriented to person, place, and time. She appears well-developed and well-nourished. No distress.  HENT:  Head: Normocephalic and atraumatic.  Cardiovascular: Regular rhythm, S1 normal and S2 normal.  Bradycardia present.   Pulmonary/Chest: Effort normal and breath sounds normal.    Genitourinary:     No evidence of draining or ttp  Musculoskeletal:       Feet:  Neurological: She is alert and oriented to person, place, and time.  In wheelchair  Skin: She is not diaphoretic.  See GU  Psychiatric: She has a normal mood and affect. Her speech is normal and behavior is normal. Judgment and thought content normal. Cognition  and memory are normal.          Assessment & Plan:  F/u 12/2012

## 2012-09-20 NOTE — Assessment & Plan Note (Signed)
Resolved after I&D and completion of Bactrim  Advised to return if notices any other abscesses

## 2012-09-20 NOTE — Progress Notes (Signed)
Case discussed with Dr. McLean soon after the resident saw the patient.  We reviewed the resident's history and exam and pertinent patient test results.  I agree with the assessment, diagnosis, and plan of care documented in the resident's note. 

## 2012-09-20 NOTE — Assessment & Plan Note (Signed)
Lipid Panel     Component Value Date/Time   CHOL 198 07/04/2012 1538   TRIG 199* 07/04/2012 1538   HDL 45 07/04/2012 1538   CHOLHDL 4.4 07/04/2012 1538   VLDL 40 07/04/2012 1538   LDLCALC 113* 07/04/2012 1538   LDL goal <160.

## 2012-09-20 NOTE — Patient Instructions (Addendum)
Please follow up in 12/2012   Abscess An abscess (boil or furuncle) is an infected area on or under the skin. This area is filled with yellowish-white fluid (pus) and other material (debris). HOME CARE   Only take medicines as told by your doctor.  If you were given antibiotic medicine, take it as directed. Finish the medicine even if you start to feel better.  If gauze is used, follow your doctor's directions for changing the gauze.  To avoid spreading the infection:  Keep your abscess covered with a bandage.  Wash your hands well.  Do not share personal care items, towels, or whirlpools with others.  Avoid skin contact with others.  Keep your skin and clothes clean around the abscess.  Keep all doctor visits as told. GET HELP RIGHT AWAY IF:   You have more pain, puffiness (swelling), or redness in the wound site.  You have more fluid or blood coming from the wound site.  You have muscle aches, chills, or you feel sick.  You have a fever. MAKE SURE YOU:   Understand these instructions.  Will watch your condition.  Will get help right away if you are not doing well or get worse. Document Released: 08/02/2007 Document Revised: 08/15/2011 Document Reviewed: 04/28/2011 [facility] Patient Information 2014 [place], [place].  Fall Prevention Falls are the leading cause of injuries, accidents, and accidental deaths in people over the age of 64. Falling is a real threat to your ability to live on your own. CAUSES   Poor eyesight or poor hearing can make you more likely to fall.   Illnesses and physical conditions can affect your strength and balance.   Poor lighting, throw rugs and pets in your home can make you more likely to trip or slip.   The side effects of some medicines can upset your balance and lead to falling. These include medicines for depression, sleep problems, high blood pressure, diabetes, and heart conditions.  PREVENTION  Be sure your home is as safe as  possible. Here are some tips:  Wear shoes with non-skid soles (not house slippers).   Be sure your home and outside area are well lit.   Use night lights throughout your house, including hallways and stairways.   Remove clutter and clean up spills on floors and walkways.   Remove throw rugs or fasten them to the floor with carpet tape. Tack down carpet edges.   Do not place electrical cords across pathways.   Install grab bars in your bathtub, shower, and toilet area. Towel bars should not be used as a grab bar.   Install handrails on both sides of stairways.   Do not climb on stools or stepladders. Get someone else to help with jobs that require climbing.   Do not wax your floors at all, or use a non-skid wax.   Repair uneven or unsafe sidewalks, walkways or stairs.   Keep frequently used items within reach.   Be aware of pets so you do not trip.  Get regular check-ups from your doctor, and take good care of yourself:  Have your eyes checked every year for vision changes, cataracts, glaucoma, and other eye problems. Wear eyeglasses as directed.   Have your hearing checked every 2 years, or anytime you or others think that you cannot hear well. Use hearing aids as directed.   See your caregiver if you have foot pain or corns. Sore feet can contribute to falls.   Let your caregiver know if a  medicine is making you feel dizzy or making you lose your balance.   Use a cane, walker, or wheelchair as directed. Use walker or wheelchair brakes when getting in and out.   When you get up from bed, sit on the side of the bed for 1 to 2 minutes before you stand up. This will give your blood pressure time to adjust, and you will feel less dizzy.   If you need to go to the bathroom often, consider using a bedside commode.  Keep your body in good shape:  Get regular exercise, especially walking.   Do exercises to strengthen the muscles you use for walking and lifting.   Do not smoke.    Minimize use of alcohol.  SEEK IMMEDIATE MEDICAL CARE IF:   You feel dizzy, weak, or unsteady on your feet.   You feel confused.   You fall.  Document Released: 02/13/2005 Document Revised: 02/02/2011 Document Reviewed: 08/10/2006 [facility] Patient Information 2012 [place], [place]. Fall Prevention and Home Safety Falls cause injuries and can affect all age groups. It is possible to use preventive measures to significantly decrease the likelihood of falls. There are many simple measures which can make your home safer and prevent falls. OUTDOORS  Repair cracks and edges of walkways and driveways.  Remove high doorway thresholds.  Trim shrubbery on the main path into your home.  Have good outside lighting.  Clear walkways of tools, rocks, debris, and clutter.  Check that handrails are not broken and are securely fastened. Both sides of steps should have handrails.  Have leaves, snow, and ice cleared regularly.  Use sand or salt on walkways during winter months.  In the garage, clean up grease or oil spills. BATHROOM  Install night lights.  Install grab bars by the toilet and in the tub and shower.  Use non-skid mats or decals in the tub or shower.  Place a plastic non-slip stool in the shower to sit on, if needed.  Keep floors dry and clean up all water on the floor immediately.  Remove soap buildup in the tub or shower on a regular basis.  Secure bath mats with non-slip, double-sided rug tape.  Remove throw rugs and tripping hazards from the floors. BEDROOMS  Install night lights.  Make sure a bedside light is easy to reach.  Do not use oversized bedding.  Keep a telephone by your bedside.  Have a firm chair with side arms to use for getting dressed.  Remove throw rugs and tripping hazards from the floor. KITCHEN  Keep handles on pots and pans turned toward the center of the stove. Use back burners when possible.  Clean up spills quickly and allow  time for drying.  Avoid walking on wet floors.  Avoid hot utensils and knives.  Position shelves so they are not too high or low.  Place commonly used objects within easy reach.  If necessary, use a sturdy step stool with a grab bar when reaching.  Keep electrical cables out of the way.  Do not use floor polish or wax that makes floors slippery. If you must use wax, use non-skid floor wax.  Remove throw rugs and tripping hazards from the floor. STAIRWAYS  Never leave objects on stairs.  Place handrails on both sides of stairways and use them. Fix any loose handrails. Make sure handrails on both sides of the stairways are as long as the stairs.  Check carpeting to make sure it is firmly attached along stairs. Make repairs  to worn or loose carpet promptly.  Avoid placing throw rugs at the top or bottom of stairways, or properly secure the rug with carpet tape to prevent slippage. Get rid of throw rugs, if possible.  Have an electrician put in a light switch at the top and bottom of the stairs. OTHER FALL PREVENTION TIPS  Wear low-heel or rubber-soled shoes that are supportive and fit well. Wear closed toe shoes.  When using a stepladder, make sure it is fully opened and both spreaders are firmly locked. Do not climb a closed stepladder.  Add color or contrast paint or tape to grab bars and handrails in your home. Place contrasting color strips on first and last steps.  Learn and use mobility aids as needed. Install an electrical emergency response system.  Turn on lights to avoid dark areas. Replace light bulbs that burn out immediately. Get light switches that glow.  Arrange furniture to create clear pathways. Keep furniture in the same place.  Firmly attach carpet with non-skid or double-sided tape.  Eliminate uneven floor surfaces.  Select a carpet pattern that does not visually hide the edge of steps.  Be aware of all pets. OTHER HOME SAFETY TIPS  Set the water  temperature for 120 F (48.8 C).  Keep emergency numbers on or near the telephone.  Keep smoke detectors on every level of the home and near sleeping areas. Document Released: 02/03/2002 Document Revised: 08/15/2011 Document Reviewed: 05/05/2011 [facility] Patient Information 2014 [place], [place].

## 2012-09-20 NOTE — Assessment & Plan Note (Signed)
  Fall Screening 07/04/2012 09/05/2012 09/20/2012  Falls in the past year? No No Yes  Number of falls in past year - - 2 or more  Risk Factor Category  - - (No Data)      Assessment: Progress toward fall prevention goal:  deteriorated Comments: likely due to cerebral palsy and spasticity of muscles  Plan: Fall prevention plans: Patient has falls likely due to cerebral palsy and spasticity of muscles. Given information about fall prevention  Educational resources provided: brochure Self management tools provided: home fall prevention checklist

## 2012-09-20 NOTE — Assessment & Plan Note (Signed)
Cysts do not appear infected or draining or tender Advised patient to return if this changes  No treatment necessary and will resolve on own but if not can try topical Bactroban in the future.

## 2013-01-08 ENCOUNTER — Other Ambulatory Visit: Payer: Self-pay | Admitting: *Deleted

## 2013-01-09 MED ORDER — BACLOFEN 10 MG PO TABS: 10.0000 mg | ORAL_TABLET | Freq: Three times a day (TID) | ORAL | Status: DC

## 2013-01-10 ENCOUNTER — Other Ambulatory Visit: Payer: Self-pay | Admitting: *Deleted

## 2013-01-14 MED ORDER — BENZONATATE 100 MG PO CAPS: 100.0000 mg | ORAL_CAPSULE | Freq: Three times a day (TID) | ORAL | Status: DC | PRN

## 2013-05-29 ENCOUNTER — Other Ambulatory Visit: Payer: Self-pay | Admitting: Internal Medicine

## 2013-05-29 DIAGNOSIS — G809 Cerebral palsy, unspecified: Secondary | ICD-10-CM

## 2013-06-27 ENCOUNTER — Encounter: Payer: Self-pay | Admitting: Internal Medicine

## 2013-06-27 ENCOUNTER — Ambulatory Visit (INDEPENDENT_AMBULATORY_CARE_PROVIDER_SITE_OTHER): Payer: Medicaid Other | Admitting: Internal Medicine

## 2013-06-27 VITALS — BP 120/72 | HR 84 | Temp 97.2°F | Ht 64.0 in | Wt 144.8 lb

## 2013-06-27 DIAGNOSIS — R569 Unspecified convulsions: Secondary | ICD-10-CM

## 2013-06-27 DIAGNOSIS — G40909 Epilepsy, unspecified, not intractable, without status epilepticus: Secondary | ICD-10-CM

## 2013-06-27 DIAGNOSIS — G8114 Spastic hemiplegia affecting left nondominant side: Secondary | ICD-10-CM

## 2013-06-27 DIAGNOSIS — Z0000 Encounter for general adult medical examination without abnormal findings: Secondary | ICD-10-CM

## 2013-06-27 DIAGNOSIS — G809 Cerebral palsy, unspecified: Secondary | ICD-10-CM

## 2013-06-27 NOTE — Assessment & Plan Note (Signed)
Patient vehemently denies ever being sexually active, Aid who sis present as confirms she has been at least since she has been with pt since last Dec- 01/2013.  - Will Defer Pap smear.

## 2013-06-27 NOTE — Assessment & Plan Note (Signed)
Forms from [facility] to be filled, FL2 forms all filled and request for shower chair pending Pt eval- prescription given.

## 2013-06-27 NOTE — Assessment & Plan Note (Signed)
Patient cannot remember when last she had a seizure, Aid present says that since she has been with pt 01/2013, pt has not had a seizure. - Complaint with medication- depakote. - Levels not checked here- [facility]-87.4, 03/30/2012  and levels have been within normal range.  - Pt has Caregivers round the clock, that take 8 hr shifts, and they help pt with her medication.  Plan- Cont depakote- 250mg  daily.
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
## 2013-06-27 NOTE — Patient Instructions (Signed)
Please continue with your medications as you have been taking them, especially the medication called Depakote to prevent seizures.

## 2013-06-27 NOTE — Progress Notes (Signed)
Case discussed with Dr. Emokpae at the time of the visit.  We reviewed the resident's history and exam and pertinent patient test results.  I agree with the assessment, diagnosis, and plan of care documented in the resident's note. 

## 2013-07-08 NOTE — Addendum Note (Signed)
Addended by: Onnie BoerEMOKPAE, Marily Konczal E on: 07/08/2013 03:04 PM   Modules accepted: Orders

## 2013-07-28 ENCOUNTER — Ambulatory Visit: Payer: Medicaid Other | Attending: Internal Medicine | Admitting: Physical Therapy

## 2013-07-28 DIAGNOSIS — R5383 Other fatigue: Secondary | ICD-10-CM

## 2013-07-28 DIAGNOSIS — IMO0001 Reserved for inherently not codable concepts without codable children: Secondary | ICD-10-CM | POA: Insufficient documentation

## 2013-07-28 DIAGNOSIS — G809 Cerebral palsy, unspecified: Secondary | ICD-10-CM | POA: Insufficient documentation

## 2013-07-28 DIAGNOSIS — R5381 Other malaise: Secondary | ICD-10-CM | POA: Insufficient documentation

## 2014-01-27 ENCOUNTER — Other Ambulatory Visit: Payer: Self-pay | Admitting: *Deleted

## 2014-01-28 MED ORDER — BACLOFEN 10 MG PO TABS: 10.0000 mg | ORAL_TABLET | Freq: Three times a day (TID) | ORAL | Status: AC

## 2014-03-12 ENCOUNTER — Other Ambulatory Visit: Payer: Self-pay | Admitting: *Deleted

## 2014-03-19 MED ORDER — BENZONATATE 100 MG PO CAPS: 100.0000 mg | ORAL_CAPSULE | Freq: Three times a day (TID) | ORAL | Status: DC | PRN

## 2014-07-13 ENCOUNTER — Telehealth: Payer: Self-pay | Admitting: Internal Medicine

## 2014-07-13 NOTE — Telephone Encounter (Signed)
Call to patient to confirm appointment for 07/14/14 at 2:15 lmtcb

## 2014-07-14 ENCOUNTER — Encounter: Payer: Self-pay | Admitting: Internal Medicine

## 2014-07-14 ENCOUNTER — Ambulatory Visit (INDEPENDENT_AMBULATORY_CARE_PROVIDER_SITE_OTHER): Payer: Medicaid Other | Admitting: Internal Medicine

## 2014-07-14 VITALS — BP 122/67 | HR 78 | Temp 98.2°F | Ht 64.0 in

## 2014-07-14 DIAGNOSIS — G808 Other cerebral palsy: Secondary | ICD-10-CM

## 2014-07-14 DIAGNOSIS — F639 Impulse disorder, unspecified: Secondary | ICD-10-CM | POA: Diagnosis not present

## 2014-07-14 DIAGNOSIS — R569 Unspecified convulsions: Secondary | ICD-10-CM

## 2014-07-14 DIAGNOSIS — Z0000 Encounter for general adult medical examination without abnormal findings: Secondary | ICD-10-CM

## 2014-07-14 DIAGNOSIS — G40909 Epilepsy, unspecified, not intractable, without status epilepticus: Secondary | ICD-10-CM

## 2014-07-14 LAB — COMPLETE METABOLIC PANEL WITH GFR
ALK PHOS: 43 U/L (ref 39–117)
ALT: 12 U/L (ref 0–35)
AST: 16 U/L (ref 0–37)
Albumin: 4 g/dL (ref 3.5–5.2)
BILIRUBIN TOTAL: 0.3 mg/dL (ref 0.2–1.2)
BUN: 7 mg/dL (ref 6–23)
CO2: 29 mEq/L (ref 19–32)
Calcium: 9.5 mg/dL (ref 8.4–10.5)
Chloride: 97 mEq/L (ref 96–112)
Creat: 0.33 mg/dL — ABNORMAL LOW (ref 0.50–1.10)
GFR, Est African American: >89 mL/min
GLUCOSE: 92 mg/dL (ref 70–99)
Potassium: 4.1 mEq/L (ref 3.5–5.3)
SODIUM: 138 meq/L (ref 135–145)
TOTAL PROTEIN: 7.1 g/dL (ref 6.0–8.3)

## 2014-07-14 LAB — CBC
HEMATOCRIT: 39.2 % (ref 36.0–46.0)
HEMOGLOBIN: 13 g/dL (ref 12.0–15.0)
MCH: 26.6 pg (ref 26.0–34.0)
MCHC: 33.2 g/dL (ref 30.0–36.0)
MCV: 80.3 fL (ref 78.0–100.0)
MPV: 9.9 fL (ref 8.6–12.4)
Platelets: 341 10*3/uL (ref 150–400)
RBC: 4.88 MIL/uL (ref 3.87–5.11)
RDW: 14.3 % (ref 11.5–15.5)
WBC: 6.5 10*3/uL (ref 4.0–10.5)

## 2014-07-14 MED ORDER — RISPERIDONE 0.5 MG PO TABS: 0.5000 mg | ORAL_TABLET | Freq: Every day | ORAL | Status: DC
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
## 2014-07-14 NOTE — Patient Instructions (Signed)
We will be checking your blood count and kidney function today. If there any abnormalities, we will let you know.  It was nice seeing you today.

## 2014-07-15 NOTE — Assessment & Plan Note (Signed)
Pt has never been sexually active, per pt and aide, pt lives in a group home and is always under supervision, and is dependent for activities of daily living. Documentation for 2012 states same. She also most likely would not tolerate the process as she gets easily excitable- impulse control disorder also in problem list. Filled out yearly paper work for Group home- FL2.   Plan- defer pap.

## 2014-07-15 NOTE — Assessment & Plan Note (Addendum)
Presently on risperidone- 0.5mg  PO daily. [facility] health [facility].

## 2014-07-15 NOTE — Assessment & Plan Note (Addendum)
Patient's aide says pt has not had a seizure in years, notes from 2013 state the same. Pt has been taken off her Depakote, when labs done by Psychiatrist were abnormal, per Aide. She is a resident [facility]. [facility] [address] [facility], pt is a resident there. She sees a Therapist, sportssychiatrist [facility], who started her on Risperidone and discontinued Depakote.  Plan- Cmet today. - CBC  Addendum- Normal Labs.

## 2014-07-16 NOTE — Progress Notes (Signed)
Internal Medicine Clinic Attending  Case discussed with Dr. Emokpae at the time of the visit.  We reviewed the resident's history and exam and pertinent patient test results.  I agree with the assessment, diagnosis, and plan of care documented in the resident's note.  

## 2015-04-19 ENCOUNTER — Other Ambulatory Visit: Payer: Self-pay

## 2015-04-19 DIAGNOSIS — Z1231 Encounter for screening mammogram for malignant neoplasm of breast: Secondary | ICD-10-CM

## 2015-05-07 ENCOUNTER — Ambulatory Visit (INDEPENDENT_AMBULATORY_CARE_PROVIDER_SITE_OTHER): Payer: Medicaid Other | Admitting: Internal Medicine

## 2015-05-07 ENCOUNTER — Encounter: Payer: Self-pay | Admitting: Internal Medicine

## 2015-05-07 VITALS — BP 124/65 | HR 67 | Temp 98.4°F | Ht 64.0 in | Wt 144.0 lb

## 2015-05-07 DIAGNOSIS — Z993 Dependence on wheelchair: Secondary | ICD-10-CM | POA: Diagnosis not present

## 2015-05-07 DIAGNOSIS — L209 Atopic dermatitis, unspecified: Secondary | ICD-10-CM | POA: Diagnosis not present

## 2015-05-07 DIAGNOSIS — F639 Impulse disorder, unspecified: Secondary | ICD-10-CM | POA: Diagnosis not present

## 2015-05-07 DIAGNOSIS — Z8669 Personal history of other diseases of the nervous system and sense organs: Secondary | ICD-10-CM | POA: Diagnosis not present

## 2015-05-07 DIAGNOSIS — E785 Hyperlipidemia, unspecified: Secondary | ICD-10-CM | POA: Diagnosis not present

## 2015-05-07 DIAGNOSIS — G809 Cerebral palsy, unspecified: Secondary | ICD-10-CM | POA: Diagnosis not present

## 2015-05-07 LAB — COMPREHENSIVE METABOLIC PANEL
ALBUMIN: 4.1 g/dL (ref 3.5–5.0)
ALT: 15 U/L (ref 14–54)
AST: 24 U/L (ref 15–41)
Alkaline Phosphatase: 51 U/L (ref 38–126)
Anion gap: 10 (ref 5–15)
BUN: 8 mg/dL (ref 6–20)
CHLORIDE: 101 mmol/L (ref 101–111)
CO2: 29 mmol/L (ref 22–32)
Calcium: 10 mg/dL (ref 8.9–10.3)
Creatinine, Ser: 0.35 mg/dL — ABNORMAL LOW (ref 0.44–1.00)
GFR calc Af Amer: >60 mL/min (ref >60)
GLUCOSE: 101 mg/dL — AB (ref 65–99)
POTASSIUM: 4 mmol/L (ref 3.5–5.1)
SODIUM: 140 mmol/L (ref 135–145)
Total Bilirubin: 0.3 mg/dL (ref 0.3–1.2)
Total Protein: 7.6 g/dL (ref 6.5–8.1)

## 2015-05-07 MED ORDER — HYDROCORTISONE 1 % EX CREA: TOPICAL_CREAM | CUTANEOUS | Status: DC

## 2015-05-07 MED ORDER — HYDROCORTISONE 1 % EX CREA: TOPICAL_CREAM | Freq: Two times a day (BID) | CUTANEOUS | Status: DC | PRN
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
## 2015-05-07 NOTE — Patient Instructions (Addendum)
We will be doing some blood work today.   Use OTC hydrocortisone cream for the rash on your side only as needed.

## 2015-05-07 NOTE — Assessment & Plan Note (Addendum)
No complaints today. She is on a wheel chair most of the day. Some complaints per aide of having a rash which improved with triamcinolone prescribed by another provider. RAsh has significantly improved by AIDE asking for refills. Plan- can d/c triamcinolone and use OTC hydrocortisone as needed. - Also complaints of urinary incontinence, pt usually incontinent only at night, will get UA.

## 2015-05-08 LAB — CBC
HEMOGLOBIN: 13.1 g/dL (ref 11.1–15.9)
Hematocrit: 39.9 % (ref 34.0–46.6)
MCH: 26.8 pg (ref 26.6–33.0)
MCHC: 32.8 g/dL (ref 31.5–35.7)
MCV: 82 fL (ref 79–97)
Platelets: 371 10*3/uL (ref 150–379)
RBC: 4.88 x10E6/uL (ref 3.77–5.28)
RDW: 14.9 % (ref 12.3–15.4)
WBC: 7.3 10*3/uL (ref 3.4–10.8)

## 2015-05-08 LAB — LIPID PANEL
CHOLESTEROL TOTAL: 255 mg/dL — AB (ref 100–199)
Chol/HDL Ratio: 4.8 ratio units — ABNORMAL HIGH (ref 0.0–4.4)
HDL: 53 mg/dL (ref >39)
LDL Calculated: 177 mg/dL — ABNORMAL HIGH (ref 0–99)
TRIGLYCERIDES: 127 mg/dL (ref 0–149)
VLDL CHOLESTEROL CAL: 25 mg/dL (ref 5–40)

## 2015-05-10 NOTE — Assessment & Plan Note (Signed)
Pt is on low dose of risperidone, follows with a psychiatrist on [address]. Pt mood appears stable today. Will get Blood work, TSH, lipid profile, CBC and Electrolytes while on risperidone. Consider checking Qtc on next visit , as Risperidone can cause Qtc prolongation.   Addendum- Lipid panel-  With increased LDL- 177, total- 255, will counsell on weightloss and diet modification, as she has nop cardiovascular risk factors, does not smoke and does not drink alcohol. Electrolytes, kidney function, and CBC WNL.

## 2015-05-10 NOTE — Assessment & Plan Note (Signed)
On patients left side. Was prescribed topical triamcinolone with improvement, will d/c and can use OTC hydrocortisone as needed.

## 2015-05-10 NOTE — Assessment & Plan Note (Signed)
No seizures in several years. Not on meds.

## 2015-05-11 NOTE — Progress Notes (Signed)
Internal Medicine Clinic Attending  Case discussed with Dr. Emokpae at the time of the visit.  We reviewed the resident's history and exam and pertinent patient test results.  I agree with the assessment, diagnosis, and plan of care documented in the resident's note.  

## 2015-05-25 ENCOUNTER — Other Ambulatory Visit: Payer: Self-pay

## 2015-05-27 ENCOUNTER — Ambulatory Visit
Admission: RE | Admit: 2015-05-27 | Discharge: 2015-05-27 | Disposition: A | Discharge status: Home / Self Care | Payer: Medicaid Other | Source: Ambulatory Visit

## 2015-05-27 DIAGNOSIS — Z1231 Encounter for screening mammogram for malignant neoplasm of breast: Secondary | ICD-10-CM

## 2015-08-13 ENCOUNTER — Encounter: Payer: Self-pay | Admitting: *Deleted

## 2015-08-17 NOTE — Assessment & Plan Note (Addendum)
Checked lipid panel. Recommended while on Antipsychotic med- Risperidone. History of Hyperlipidemia.  04/2015- LDL- 177, HDL- 53, Total chol- 255. (Not fasting).  Using ACC/ACA Pooled Cohort CVD risk calculator- This patient has 0.66% risk of developing ASCVD in 10 years. ( Recommendation for treatment is Calculated Risk >7.5%. She therefore does not meet criteria for treatment. At this time her risk of long term treatment do not outweigh the benefits.  Would recommend treatment if her LDL is >190, her comorbidties change and therefore increases her risk for CVD.  - Emphasize, lifestyle modification- with diet especially, as much as her Caregivers are able to provide.  - Also check Fasting lipid panel, if still elevated, then consider starting statin.

## 2015-09-30 ENCOUNTER — Ambulatory Visit (INDEPENDENT_AMBULATORY_CARE_PROVIDER_SITE_OTHER): Payer: Medicaid Other | Admitting: Internal Medicine

## 2015-09-30 ENCOUNTER — Encounter: Payer: Self-pay | Admitting: Internal Medicine

## 2015-09-30 VITALS — BP 112/71 | HR 62 | Temp 98.0°F

## 2015-09-30 DIAGNOSIS — G802 Spastic hemiplegic cerebral palsy: Secondary | ICD-10-CM

## 2015-09-30 DIAGNOSIS — E785 Hyperlipidemia, unspecified: Secondary | ICD-10-CM | POA: Diagnosis present

## 2015-09-30 DIAGNOSIS — F639 Impulse disorder, unspecified: Secondary | ICD-10-CM

## 2015-09-30 DIAGNOSIS — G40909 Epilepsy, unspecified, not intractable, without status epilepticus: Secondary | ICD-10-CM | POA: Diagnosis not present

## 2015-09-30 DIAGNOSIS — Z30431 Encounter for routine checking of intrauterine contraceptive device: Secondary | ICD-10-CM

## 2015-09-30 DIAGNOSIS — G8114 Spastic hemiplegia affecting left nondominant side: Secondary | ICD-10-CM

## 2015-09-30 NOTE — Patient Instructions (Signed)
It was a pleasure seeing you today. Thank you for choosing [facility] for your healthcare needs.  -- Return to clinic in 6 months for routine visit

## 2015-09-30 NOTE — Assessment & Plan Note (Signed)
No new seizure activity.

## 2015-09-30 NOTE — Assessment & Plan Note (Signed)
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

## 2015-09-30 NOTE — Progress Notes (Signed)
   CC: Routine clinic visit HPI: [name] is a 40 y.o. 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 She denies headache, night sweats or weight loss. She denies chest pain, shortness of breath, nausea, vomiting or abdominal pain. She has no additional acute complaints or concerns today's visit.  Please see problem-based charting for status of medical issues pertinent to this visit.    Review of Systems: A complete ROS was negative except as per HPI.  Physical Exam: Vitals:   09/30/15 1613  BP: 112/71  Pulse: 62  Temp: 98 F (36.7 C)  TempSrc: Oral  SpO2: 100%   BP 112/71 (BP Location: Right Arm, Patient Position: Sitting, Cuff Size: Normal)   Pulse 62   Temp 98 F (36.7 C) (Oral)   SpO2 100% Comment: room air General appearance: alert, cooperative and Sitting in wheelchair Head: Normocephalic, without obvious abnormality, atraumatic Lungs: clear to auscultation bilaterally Heart: regular rate and rhythm, S1, S2 normal, no murmur, click, rub or gallop Abdomen: soft, non-tender; bowel sounds normal; no masses,  no organomegaly and No abdominal bruits auscultated Extremities: No edema, left upper extremity contracted, bilateral abrasions over her patellas which are well healing and showed no evidence of infection  Assessment & Plan:  See encounters tab for problem based medical decision making. Patient seen with Dr. Cyndie Chime  Signed: Thomasene Lot, MD 09/30/2015, 5:54 PM  Pager: [phone]

## 2015-09-30 NOTE — Assessment & Plan Note (Signed)
Patient with hyperlipidemia currently on respiratory. Her psychiatrist requested that we order lab work for her upcoming appointment. Currently, she is low risk for cardiovascular disease and we will not treat her hyperlipidemia unless her recent lab work shows worsening cholesterol levels. -- Lipid panel -- CBC -- Basic metabolic panel -- Return to clinic for routine health maintenance in 6 months

## 2015-10-01 LAB — CBC
Hematocrit: 35.9 % (ref 34.0–46.6)
Hemoglobin: 11.6 g/dL (ref 11.1–15.9)
MCH: 26.5 pg — AB (ref 26.6–33.0)
MCHC: 32.3 g/dL (ref 31.5–35.7)
MCV: 82 fL (ref 79–97)
PLATELETS: 396 10*3/uL — AB (ref 150–379)
RBC: 4.38 x10E6/uL (ref 3.77–5.28)
RDW: 15 % (ref 12.3–15.4)
WBC: 8.5 10*3/uL (ref 3.4–10.8)

## 2015-10-01 LAB — BMP8+ANION GAP
Anion Gap: 15 mmol/L (ref 10.0–18.0)
BUN / CREAT RATIO: 26 — AB (ref 9–23)
BUN: 10 mg/dL (ref 6–24)
CO2: 25 mmol/L (ref 18–29)
CREATININE: 0.38 mg/dL — AB (ref 0.57–1.00)
Calcium: 9.5 mg/dL (ref 8.7–10.2)
Chloride: 100 mmol/L (ref 96–106)
GFR, EST AFRICAN AMERICAN: 153 mL/min/{1.73_m2} (ref >59)
GFR, EST NON AFRICAN AMERICAN: 133 mL/min/{1.73_m2} (ref >59)
Glucose: 91 mg/dL (ref 65–99)
Potassium: 4 mmol/L (ref 3.5–5.2)
SODIUM: 140 mmol/L (ref 134–144)

## 2015-10-01 LAB — LIPID PANEL
CHOL/HDL RATIO: 4.5 ratio — AB (ref 0.0–4.4)
CHOLESTEROL TOTAL: 267 mg/dL — AB (ref 100–199)
HDL: 59 mg/dL (ref >39)
LDL CALC: 180 mg/dL — AB (ref 0–99)
Triglycerides: 138 mg/dL (ref 0–149)
VLDL CHOLESTEROL CAL: 28 mg/dL (ref 5–40)

## 2015-10-01 NOTE — Progress Notes (Signed)
Medicine attending: I personally interviewed and briefly examined this patient on the day of the patient visit and reviewed pertinent clinical and laboratory data  with resident physician Dr. Forde Radon and we discussed a management plan.

## 2016-01-19 ENCOUNTER — Ambulatory Visit: Payer: Medicaid Other | Admitting: Family Medicine

## 2016-01-24 NOTE — Addendum Note (Signed)
Addended by: Neomia DearPOWERS, Chalice Philbert E on: 01/24/2016 03:21 PM   Modules accepted: Orders

## 2016-04-06 ENCOUNTER — Ambulatory Visit (INDEPENDENT_AMBULATORY_CARE_PROVIDER_SITE_OTHER): Payer: Medicaid Other | Admitting: Internal Medicine

## 2016-04-06 ENCOUNTER — Encounter (INDEPENDENT_AMBULATORY_CARE_PROVIDER_SITE_OTHER): Payer: Self-pay

## 2016-04-06 VITALS — BP 124/58 | HR 87 | Temp 98.6°F

## 2016-04-06 DIAGNOSIS — G809 Cerebral palsy, unspecified: Secondary | ICD-10-CM

## 2016-04-06 DIAGNOSIS — F639 Impulse disorder, unspecified: Secondary | ICD-10-CM

## 2016-04-06 DIAGNOSIS — Z23 Encounter for immunization: Secondary | ICD-10-CM

## 2016-04-06 DIAGNOSIS — R569 Unspecified convulsions: Secondary | ICD-10-CM

## 2016-04-06 DIAGNOSIS — F71 Moderate intellectual disabilities: Secondary | ICD-10-CM

## 2016-04-06 DIAGNOSIS — Z993 Dependence on wheelchair: Secondary | ICD-10-CM | POA: Diagnosis not present

## 2016-04-06 DIAGNOSIS — G40909 Epilepsy, unspecified, not intractable, without status epilepticus: Secondary | ICD-10-CM

## 2016-04-06 DIAGNOSIS — Z79899 Other long term (current) drug therapy: Secondary | ICD-10-CM

## 2016-04-06 DIAGNOSIS — Z0000 Encounter for general adult medical examination without abnormal findings: Secondary | ICD-10-CM

## 2016-04-06 MED ORDER — HYDROCORTISONE 1 % EX CREA: TOPICAL_CREAM | Freq: Two times a day (BID) | CUTANEOUS | 1 refills | Status: DC | PRN

## 2016-04-06 NOTE — Assessment & Plan Note (Signed)
Patient will have influenza vaccination today. Additionally, patient will have referral to OB/GYN for Pap smear and routine women's health care. -- Influenza vaccine -- Referral to OB/GYN

## 2016-04-06 NOTE — Patient Instructions (Signed)
It was a pleasure seeing you today. Thank you for choosing [facility] for your healthcare needs.  Please continue to take your medications as prescribed.  Today we have given you the flu shot and you might be sore in the arm for several days where you received this.  We have made a referral to OB/GYN. Please follow up with this appointment.  Please return to clinic in 6 months for ongoing management or sooner as needed.

## 2016-04-06 NOTE — Assessment & Plan Note (Signed)
The patient has documented impulse control disorder. She is followed by greenlight counseling. 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 She also has a documented seizure disorder but has not been on antiepileptic medications given that she has not had a seizure in several years. For now we will continue with her psychiatric medications as they're currently prescribed. -- Risperidone 0.5 mg daily at that time -- Sertraline 100 mg daily

## 2016-04-06 NOTE — Progress Notes (Signed)
   CC: Medication management and gynecology referral HPI: [name] is a 41 y.o. female with a h/o of gastro-esophageal reflux disease, seizure disorder, infantile cerebral palsy and moderate mental retardation who presents who presents for routine visit, for medication management and for a referral to gynecology. She denies shortness of breath or chest pain. She denies nausea, vomiting or abdominal pain. She has no additional acute complaints or concerns today.   Review of Systems: As documented above  Physical Exam: There were no vitals filed for this visit. General appearance: alert, cooperative and In wheelchair. Left arm contracted. Lungs: clear to auscultation bilaterally Heart: regular rate and rhythm, S1, S2 normal, no murmur, click, rub or gallop Abdomen: soft, non-tender; bowel sounds normal; no masses,  no organomegaly Extremities: Left arm contracted. Right arm mobile without rigidity on passive motion. No lower extremity edema. Patient in wheelchair. Is able to ambulate with assistance.  Assessment & Plan:  See encounters tab for problem based medical decision making. Patient discussed with Dr. Cyndie ChimeGranfortuna  Signed: Thomasene LotJames Eisa Necaise, MD 04/06/2016, 2:08 PM  Pager: [phone]

## 2016-04-06 NOTE — Assessment & Plan Note (Signed)
Patient with history of infantile cerebral palsy. She requires help for her ADLs. At today's visit I was brought an adult care home and FL2 form and requested to sign this. I have noticed there are several medications listed that we are not actively prescribing. It is my understanding that the patient also sees a psychiatrist who may be prescribing some of her additional medications. I will sign this form as I think she does need skilled nursing care or a group setting given her history of infantile cerebral palsy and inability to do her ADLs. However, since I am unclear on where several of these medications are being prescribed from I will only list below the medications that I am able to verify in our system. The other medications that are listed on the FL2 form I am unable to verify that she is taking these or that they're necessary. Please discuss with her other physicians regarding the prescribing of these medications as they should be able to better clarify their usage and necessity. -- Baclofen 10 mg 3 times a day -- Calcium vitamin D -- Cetirizine 10 mg daily -- Diphenhydramine 25 mg when necessary bedtime -- Gabapentin 400 mg daily -- Risperidone 0.5 mg at bedtime -- Sertraline 100 mg daily

## 2016-04-06 NOTE — Assessment & Plan Note (Signed)
Patient has a seizure disorder. She has not had a seizure in several years and had been taken off her medications prior. She has not had a seizure in the interval. She was doing well without her antiepileptic medication. It appears this was initially discontinued by a psychiatrist at her facility. Given that the patient is stable in regards to this condition I will not start medication at this time. We will continue to monitor clinically and if the patient has a subsequent seizure we'll decide on treatment at that time. -- Monitor clinically

## 2016-04-06 NOTE — Progress Notes (Signed)
Medicine attending: Medical history, presenting problems, physical findings, and medications, reviewed with resident physician Dr Kizzie Cotten Taylor on the day of the patient visit and I concur with his evaluation and management plan. 

## 2016-04-13 ENCOUNTER — Other Ambulatory Visit: Payer: Self-pay | Admitting: Internal Medicine

## 2016-04-13 DIAGNOSIS — Z1231 Encounter for screening mammogram for malignant neoplasm of breast: Secondary | ICD-10-CM

## 2016-05-04 ENCOUNTER — Ambulatory Visit (INDEPENDENT_AMBULATORY_CARE_PROVIDER_SITE_OTHER): Payer: Medicaid Other | Admitting: Obstetrics and Gynecology

## 2016-05-04 ENCOUNTER — Encounter: Payer: Self-pay | Admitting: Obstetrics and Gynecology

## 2016-05-04 VITALS — BP 111/71 | HR 76 | Wt 168.0 lb

## 2016-05-04 DIAGNOSIS — Z30431 Encounter for routine checking of intrauterine contraceptive device: Secondary | ICD-10-CM
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
## 2016-05-31 ENCOUNTER — Ambulatory Visit
Admission: RE | Admit: 2016-05-31 | Discharge: 2016-05-31 | Disposition: A | Discharge status: Home / Self Care | Payer: Medicaid Other | Source: Ambulatory Visit | Attending: Internal Medicine | Admitting: Internal Medicine

## 2016-05-31 DIAGNOSIS — Z1231 Encounter for screening mammogram for malignant neoplasm of breast: Secondary | ICD-10-CM

## 2016-08-07 ENCOUNTER — Encounter: Payer: Self-pay | Admitting: *Deleted

## 2017-03-05 ENCOUNTER — Ambulatory Visit (HOSPITAL_COMMUNITY): Payer: Medicaid Other

## 2017-03-05 ENCOUNTER — Ambulatory Visit (HOSPITAL_COMMUNITY)
Admission: EM | Admit: 2017-03-05 | Discharge: 2017-03-05 | Disposition: A | Discharge status: Home / Self Care | Payer: Medicaid Other | Attending: Family Medicine | Admitting: Family Medicine

## 2017-03-05 ENCOUNTER — Ambulatory Visit (INDEPENDENT_AMBULATORY_CARE_PROVIDER_SITE_OTHER): Payer: Medicaid Other

## 2017-03-05 ENCOUNTER — Encounter (HOSPITAL_COMMUNITY): Payer: Self-pay | Admitting: Emergency Medicine

## 2017-03-05 DIAGNOSIS — W19XXXA Unspecified fall, initial encounter: Secondary | ICD-10-CM

## 2017-03-05 DIAGNOSIS — M25522 Pain in left elbow: Secondary | ICD-10-CM

## 2017-03-05 DIAGNOSIS — S0990XA Unspecified injury of head, initial encounter: Secondary | ICD-10-CM

## 2017-03-05 NOTE — Discharge Instructions (Signed)
X-ray negative for fracture or dislocation. Continue tylenol for pain, and do ice compress for the elbow. As discussed, neurology exam is limited due to baseline changes. However, she is not experiencing any nausea, vomiting, confusion, and seems to be communicating appropriately. Monitor closely for now, if seeing any changes in her behavior, headache/blurry vision, nausea/vomiting, confusion/altered mental status, dizziness, weakness, passing out, imbalance, go to the emergency department for further evaluation.
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
## 2017-03-16 ENCOUNTER — Ambulatory Visit (INDEPENDENT_AMBULATORY_CARE_PROVIDER_SITE_OTHER): Payer: Medicaid Other | Admitting: Internal Medicine

## 2017-03-16 ENCOUNTER — Other Ambulatory Visit: Payer: Self-pay

## 2017-03-16 DIAGNOSIS — G809 Cerebral palsy, unspecified: Secondary | ICD-10-CM

## 2017-03-16 DIAGNOSIS — G802 Spastic hemiplegic cerebral palsy: Secondary | ICD-10-CM

## 2017-03-16 DIAGNOSIS — Z7689 Persons encountering health services in other specified circumstances: Secondary | ICD-10-CM
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
## 2017-03-16 NOTE — Progress Notes (Signed)
Medicine attending: Medical history, presenting problems, physical findings, and medications, reviewed with resident physician Dr Jessica Hoffman on the day of the patient visit and I concur with her evaluation and management plan. 

## 2017-03-16 NOTE — Patient Instructions (Addendum)
[name],  It was a pleasure meeting you today. Please follow-up with your primary care doctor in March.

## 2017-04-23 ENCOUNTER — Other Ambulatory Visit: Payer: Self-pay | Admitting: Internal Medicine

## 2017-04-23 DIAGNOSIS — Z1231 Encounter for screening mammogram for malignant neoplasm of breast: Secondary | ICD-10-CM

## 2017-05-09 ENCOUNTER — Encounter: Payer: Self-pay | Admitting: Internal Medicine

## 2017-05-09 ENCOUNTER — Ambulatory Visit (INDEPENDENT_AMBULATORY_CARE_PROVIDER_SITE_OTHER): Payer: Medicaid Other | Admitting: Internal Medicine

## 2017-05-09 VITALS — BP 120/69 | HR 73 | Temp 98.0°F

## 2017-05-09 DIAGNOSIS — Z79899 Other long term (current) drug therapy: Secondary | ICD-10-CM | POA: Diagnosis not present

## 2017-05-09 DIAGNOSIS — G809 Cerebral palsy, unspecified: Secondary | ICD-10-CM

## 2017-05-09 DIAGNOSIS — Z008 Encounter for other general examination: Secondary | ICD-10-CM

## 2017-05-09 DIAGNOSIS — Z23 Encounter for immunization: Secondary | ICD-10-CM | POA: Insufficient documentation

## 2017-05-09 NOTE — Patient Instructions (Signed)
Thank you for allowing us to care for you  Continue current medications  Follow up in about 1 year
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
## 2017-05-09 NOTE — Assessment & Plan Note (Signed)
Patient with history of infantile cerebral palsy. She is presented today with a yearly physical form from her facility, which was filled out and returned to patient and her caregiver with available vaccination records attached.Paitent and caregiver state that she is doing well and has no complaints or concerns at this time. Will continue long term medications. - Baclofen 10mg  TID - Calcium - Vitamin D - Cetirizine 10mg  Daily - Diphenhydramine 25mg  qhs PRN - Gabapentin 400mg  Daily - Risperidone 0.5mg  qhs - Sertraline 100mg  Daily

## 2017-05-09 NOTE — Assessment & Plan Note (Signed)
Flu vaccine provided today.  

## 2017-05-10 ENCOUNTER — Encounter: Payer: Medicaid Other | Admitting: Internal Medicine

## 2017-05-11 NOTE — Progress Notes (Signed)
Internal Medicine Clinic Attending  Case discussed with Dr. Melvin  at the time of the visit.  We reviewed the resident's history and exam and pertinent patient test results.  I agree with the assessment, diagnosis, and plan of care documented in the resident's note.  

## 2017-06-06 ENCOUNTER — Ambulatory Visit
Admission: RE | Admit: 2017-06-06 | Discharge: 2017-06-06 | Disposition: A | Discharge status: Home / Self Care | Payer: Medicaid Other | Source: Ambulatory Visit | Attending: Internal Medicine | Admitting: Internal Medicine

## 2017-06-06 DIAGNOSIS — Z1231 Encounter for screening mammogram for malignant neoplasm of breast: Secondary | ICD-10-CM

## 2017-12-26 ENCOUNTER — Other Ambulatory Visit: Payer: Self-pay | Admitting: Internal Medicine

## 2017-12-26 NOTE — Progress Notes (Signed)
Medication list updated with Lorazepam as noted on records from her facility. This appear to be prescribed by her psychiatrist, Dr. Brock Bad.  Ginette Otto, DO PGY-2

## 2018-05-07 ENCOUNTER — Other Ambulatory Visit: Payer: Self-pay | Admitting: *Deleted

## 2018-05-07 ENCOUNTER — Other Ambulatory Visit: Payer: Self-pay | Admitting: Internal Medicine

## 2018-05-07 DIAGNOSIS — Z1231 Encounter for screening mammogram for malignant neoplasm of breast: Secondary | ICD-10-CM

## 2018-06-12 ENCOUNTER — Ambulatory Visit: Payer: Medicaid Other

## 2018-07-13 ENCOUNTER — Emergency Department (HOSPITAL_COMMUNITY): Payer: Medicaid Other

## 2018-07-13 ENCOUNTER — Emergency Department (HOSPITAL_COMMUNITY)
Admission: EM | Admit: 2018-07-13 | Discharge: 2018-07-13 | Disposition: A | Discharge status: Home / Self Care | Payer: Medicaid Other | Attending: Emergency Medicine | Admitting: Emergency Medicine

## 2018-07-13 ENCOUNTER — Other Ambulatory Visit: Payer: Self-pay

## 2018-07-13 DIAGNOSIS — M25572 Pain in left ankle and joints of left foot: Secondary | ICD-10-CM | POA: Diagnosis not present

## 2018-07-13 DIAGNOSIS — Z79899 Other long term (current) drug therapy: Secondary | ICD-10-CM | POA: Insufficient documentation

## 2018-07-13 LAB — URINALYSIS, ROUTINE W REFLEX MICROSCOPIC
Bilirubin Urine: NEGATIVE
Glucose, UA: 50 mg/dL — AB
Hgb urine dipstick: NEGATIVE
Ketones, ur: NEGATIVE mg/dL
Leukocytes,Ua: NEGATIVE
Nitrite: NEGATIVE
Protein, ur: NEGATIVE mg/dL
Specific Gravity, Urine: 1.017 (ref 1.005–1.030)
pH: 6 (ref 5.0–8.0)

## 2018-07-13 LAB — PREGNANCY, URINE: Preg Test, Ur: NEGATIVE

## 2018-07-13 NOTE — Discharge Instructions (Addendum)
Return to ER for new symptoms or any additional concerns.

## 2018-07-13 NOTE — ED Notes (Signed)
Patient verbalizes understanding of discharge instructions. Opportunity for questioning and answering were provided. Armband removed by staff , patient discharged from ED, [name] , caretaker at bedside to take patient back home

## 2018-07-13 NOTE — ED Provider Notes (Signed)
Care assumed from previous provider PA Henderly. Please see note for further details. Case discussed, plan agreed upon. Will follow up on pending pelvis x-ray. If negative, anticipate discharge home with outpatient follow up.   X-ray reviewed showing no acute findings. Discharged home with ortho follow up and instructions per previous PA.    Ward, Chase Picket, PA-C 07/13/18 1720    Alvira Monday, MD 07/15/18 [phone]

## 2018-07-13 NOTE — ED Triage Notes (Signed)
Pt c/o left ankle pain after she had a fall yesterday ; pt states " my feet gave out on me " denies any weakness or dizziness prior to fall
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
## 2018-07-14 LAB — URINE CULTURE: Culture: 50000 — AB

## 2018-07-18 ENCOUNTER — Ambulatory Visit (INDEPENDENT_AMBULATORY_CARE_PROVIDER_SITE_OTHER): Payer: Medicaid Other | Admitting: Internal Medicine

## 2018-07-18 ENCOUNTER — Encounter: Payer: Self-pay | Admitting: Internal Medicine

## 2018-07-18 ENCOUNTER — Other Ambulatory Visit: Payer: Self-pay

## 2018-07-18 DIAGNOSIS — F71 Moderate intellectual disabilities: Secondary | ICD-10-CM

## 2018-07-18 DIAGNOSIS — F639 Impulse disorder, unspecified: Secondary | ICD-10-CM | POA: Diagnosis not present

## 2018-07-18 DIAGNOSIS — Z79899 Other long term (current) drug therapy: Secondary | ICD-10-CM

## 2018-07-18 DIAGNOSIS — G809 Cerebral palsy, unspecified: Secondary | ICD-10-CM | POA: Diagnosis not present

## 2018-07-18 NOTE — Assessment & Plan Note (Signed)
Patient with history of infantile cerebral palsy. Patient's status discussed today over the phone with her guardian and caregiver. Both state she is doing well and have no complaints. She did have a fall and injured her ankle recently and was seen in the ED for this. She was to have follow up with orthopedics this week. No notes in the chart as of yet. Caregiver states patient's Kasandra Knudsen was increased by her psychiatrist to 60mg  Dialy ans she has been doing well on this dose. -Baclofen 10mg  TID - Calcium - Vitamin D -Cetirizine 10mg  Daily -Diphenhydramine 25mg  qhs PRN -Gabapentin 400mg  Daily -Risperidone 0.5mg  qhs -Sertraline 100mg  Daily

## 2018-07-18 NOTE — Assessment & Plan Note (Addendum)
The patient has documented impulse control disorder. She is followed by [facility]. She has psychiatric comorbidities of infantile cerebral palsy and moderate mental retardation. She does have a documented seizure disorder but has not been on antiepileptic medications given that she has not had a seizure in years.  Will continue current medications. Latuda recently increased by her mental health provider per caregiver. - Risperidone 0.5mg  Daily - Sertraline 100mg  Daily - Lurasidone 60mg  Daily

## 2018-07-18 NOTE — Progress Notes (Signed)
Internal Medicine Clinic Attending  Case discussed with Dr. Melvin  at the time of the visit.  We reviewed the resident's history and exam and pertinent patient test results.  I agree with the assessment, diagnosis, and plan of care documented in the resident's note.  
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
## 2018-08-01 ENCOUNTER — Ambulatory Visit: Payer: Medicaid Other

## 2018-09-11 ENCOUNTER — Ambulatory Visit: Payer: Medicaid Other

## 2018-09-25 ENCOUNTER — Ambulatory Visit
Admission: RE | Admit: 2018-09-25 | Discharge: 2018-09-25 | Disposition: A | Discharge status: Home / Self Care | Payer: Medicaid Other | Source: Ambulatory Visit | Attending: Internal Medicine | Admitting: Internal Medicine

## 2018-09-25 ENCOUNTER — Other Ambulatory Visit: Payer: Self-pay

## 2018-09-25 DIAGNOSIS — Z1231 Encounter for screening mammogram for malignant neoplasm of breast: Secondary | ICD-10-CM

## 2018-10-16 ENCOUNTER — Telehealth: Payer: Self-pay | Admitting: *Deleted

## 2018-10-16 DIAGNOSIS — G809 Cerebral palsy, unspecified: Secondary | ICD-10-CM

## 2018-10-16 DIAGNOSIS — G8114 Spastic hemiplegia affecting left nondominant side: Secondary | ICD-10-CM

## 2018-10-16 NOTE — Telephone Encounter (Signed)
Placed call to Dimas Alexandria at [facility] to clarify fax requests. She states they need an order for a Electronics engineer. Also needs referral to PT/OT to fit patient for the w/c. After orders placed in  [facility], they will need to be faxed to her at [phone]. Hubbard Hartshorn, RN, BSN

## 2018-10-17 NOTE — Telephone Encounter (Signed)
Orders faxed to [facility] at [phone]. Hubbard Hartshorn, RN, BSN

## 2018-10-17 NOTE — Addendum Note (Signed)
Addended by: Hulan Fray on: 10/17/2018 03:17 PM   Modules accepted: Orders

## 2018-10-17 NOTE — Telephone Encounter (Signed)
Orders placed for [employer], PT and OT.   Thank you  [name]

## 2019-06-12 ENCOUNTER — Telehealth: Payer: Self-pay | Admitting: *Deleted

## 2019-06-12 NOTE — Telephone Encounter (Signed)
Received fax CMN/PA for manual w/c from [employer] Requiring OV notes within last 6 months. Patient's last visit was telehealth on 07/18/2018 with 6 month return request. Last in-person visit was 05/09/2017. Will send to PCP/Attending to see if visit to discuss need for manual w/c should be done in-person rather than another tele visit. Kinnie Feil, BSN, RN-BC

## 2019-06-14 NOTE — Telephone Encounter (Signed)
Patient has known requirement for wheelchair for chronic condition that will not improve. Unless required by the equipment company or insurance, she does not need an in person visit for this from my standpoint.

## 2019-06-17 NOTE — Telephone Encounter (Signed)
Spoke with Percell Belt, (HCPOA). She requested to schedule OV to discuss need for manual w/c with [facility]. Call placed to [facility]. Telehealth appt given for tomorrow AM. Kinnie Feil, BSN, RN-BC

## 2019-06-18 ENCOUNTER — Ambulatory Visit: Payer: Medicaid Other

## 2019-06-18 ENCOUNTER — Other Ambulatory Visit: Payer: Self-pay

## 2019-06-18 NOTE — Telephone Encounter (Signed)
Pt was scheduled for telehealth appt today, but upon further review of pt's chart-her care has been transferred to [facility] [phone]).  CMA made call to [facility] and confirmed pt's last office visit was in Feb 2021 with a follow up appt scheduled for May 2021.  CMA explained that [facility] had received CMN order for [facility] w/c, but since we are no longer her pcp-they would have to assist pt with obtaining it (CMN faxed to [facility] fax# [phone]). Call made to patient @ [phone](spoke with female) and made them aware of the situation..No further action needed, phone call complete.Marland Kitchen

## 2019-07-14 ENCOUNTER — Ambulatory Visit: Payer: Medicaid Other | Attending: Internal Medicine

## 2019-07-14 ENCOUNTER — Other Ambulatory Visit: Payer: Self-pay

## 2019-07-14 DIAGNOSIS — R293 Abnormal posture: Secondary | ICD-10-CM | POA: Insufficient documentation

## 2019-07-14 DIAGNOSIS — G801 Spastic diplegic cerebral palsy: Secondary | ICD-10-CM | POA: Insufficient documentation

## 2019-07-14 DIAGNOSIS — M6281 Muscle weakness (generalized): Secondary | ICD-10-CM | POA: Diagnosis present

## 2019-07-14 DIAGNOSIS — R2689 Other abnormalities of gait and mobility: Secondary | ICD-10-CM | POA: Diagnosis present
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
## 2019-07-15 ENCOUNTER — Telehealth: Payer: Self-pay

## 2019-07-15 NOTE — Telephone Encounter (Signed)
PT contacted [facility] to speak to Dr. Corky Downs in regards to [name] evaluation with Physical Therapy yesterday. PT was able to speak a with Front Desk Receptionist and left a message for Dr. Corky Downs stating that after completion of the evaluation and speaking further with patient and caregiver, it is a significant and taxing effort to attend outpatient services and would benefit from home health therapy services.

## 2019-09-09 ENCOUNTER — Other Ambulatory Visit: Payer: Self-pay | Admitting: Internal Medicine

## 2019-09-09 DIAGNOSIS — Z1231 Encounter for screening mammogram for malignant neoplasm of breast: Secondary | ICD-10-CM

## 2019-10-09 ENCOUNTER — Other Ambulatory Visit: Payer: Self-pay

## 2019-10-09 ENCOUNTER — Ambulatory Visit
Admission: RE | Admit: 2019-10-09 | Discharge: 2019-10-09 | Disposition: A | Discharge status: Home / Self Care | Payer: Medicaid Other | Source: Ambulatory Visit | Attending: Internal Medicine | Admitting: Internal Medicine

## 2019-10-09 DIAGNOSIS — Z1231 Encounter for screening mammogram for malignant neoplasm of breast: Secondary | ICD-10-CM

## 2020-05-11 ENCOUNTER — Other Ambulatory Visit: Payer: Self-pay | Admitting: Internal Medicine

## 2020-05-11 ENCOUNTER — Ambulatory Visit
Admission: RE | Admit: 2020-05-11 | Discharge: 2020-05-11 | Disposition: A | Discharge status: Home / Self Care | Payer: Medicaid Other | Source: Ambulatory Visit | Attending: Internal Medicine | Admitting: Internal Medicine

## 2020-05-11 ENCOUNTER — Other Ambulatory Visit: Payer: Self-pay

## 2020-05-11 DIAGNOSIS — J209 Acute bronchitis, unspecified: Secondary | ICD-10-CM

## 2020-08-11 ENCOUNTER — Encounter: Payer: Self-pay | Admitting: Pulmonary Disease

## 2020-09-15 ENCOUNTER — Institutional Professional Consult (permissible substitution): Payer: Medicaid Other | Admitting: Pulmonary Disease

## 2020-09-23 ENCOUNTER — Ambulatory Visit (INDEPENDENT_AMBULATORY_CARE_PROVIDER_SITE_OTHER): Payer: Medicare Other | Admitting: Pulmonary Disease

## 2020-09-23 ENCOUNTER — Other Ambulatory Visit: Payer: Self-pay

## 2020-09-23 ENCOUNTER — Encounter: Payer: Self-pay | Admitting: Pulmonary Disease

## 2020-09-23 VITALS — BP 118/70 | HR 67

## 2020-09-23 DIAGNOSIS — J4520 Mild intermittent asthma, uncomplicated: Secondary | ICD-10-CM

## 2020-09-23 DIAGNOSIS — J4530 Mild persistent asthma, uncomplicated: Secondary | ICD-10-CM | POA: Diagnosis not present

## 2020-09-23 DIAGNOSIS — G809 Cerebral palsy, unspecified: Secondary | ICD-10-CM | POA: Diagnosis not present

## 2020-09-23 MED ORDER — ALBUTEROL SULFATE (2.5 MG/3ML) 0.083% IN NEBU: 2.5000 mg | INHALATION_SOLUTION | RESPIRATORY_TRACT | 5 refills | Status: DC | PRN

## 2020-09-23 MED ORDER — BUDESONIDE 0.5 MG/2ML IN SUSP: 0.5000 mg | Freq: Two times a day (BID) | RESPIRATORY_TRACT | 0 refills | Status: DC

## 2020-09-23 NOTE — Patient Instructions (Signed)
Thank you for visiting Dr. Tonia Brooms at [facility]. Today we recommend the following:  DME supply for Nebulizer with albuterol solution   Return in about 1 year (around 09/23/2021), or if symptoms worsen or fail to improve, for with APP or Dr. Tonia Brooms.    Please do your part to reduce the spread of COVID-19.
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
## 2020-12-20 ENCOUNTER — Ambulatory Visit (INDEPENDENT_AMBULATORY_CARE_PROVIDER_SITE_OTHER): Payer: Medicare Other | Admitting: Obstetrics & Gynecology

## 2020-12-20 ENCOUNTER — Other Ambulatory Visit (HOSPITAL_COMMUNITY)
Admission: RE | Admit: 2020-12-20 | Discharge: 2020-12-20 | Disposition: A | Discharge status: Home / Self Care | Payer: Medicare Other | Source: Ambulatory Visit | Attending: Obstetrics & Gynecology | Admitting: Obstetrics & Gynecology

## 2020-12-20 ENCOUNTER — Other Ambulatory Visit: Payer: Self-pay

## 2020-12-20 VITALS — BP 121/64 | HR 82

## 2020-12-20 DIAGNOSIS — Z124 Encounter for screening for malignant neoplasm of cervix: Secondary | ICD-10-CM | POA: Insufficient documentation

## 2020-12-20 DIAGNOSIS — Z01419 Encounter for gynecological examination (general) (routine) without abnormal findings: Secondary | ICD-10-CM | POA: Insufficient documentation

## 2020-12-20 DIAGNOSIS — Z30433 Encounter for removal and reinsertion of intrauterine contraceptive device: Secondary | ICD-10-CM | POA: Diagnosis not present

## 2020-12-20 DIAGNOSIS — Z1151 Encounter for screening for human papillomavirus (HPV): Secondary | ICD-10-CM | POA: Diagnosis not present

## 2020-12-20 DIAGNOSIS — Z30431 Encounter for routine checking of intrauterine contraceptive device: Secondary | ICD-10-CM

## 2020-12-20 MED ORDER — LEVONORGESTREL 20.1 MCG/DAY IU IUD
1.0000 | INTRAUTERINE_SYSTEM | Freq: Once | INTRAUTERINE | Status: AC
  Administered 2020-12-20: 1 via INTRAUTERINE

## 2020-12-20 NOTE — Progress Notes (Signed)
    GYNECOLOGY OFFICE PROCEDURE NOTE  [name] is a 45 y.o. G0P0 here for Liletta IUD insertion. No GYN concerns.  Last pap smear was on 2011 and was normal.  I     IUD Removal  Patient identified, informed consent performed, consent signed.  Patient was in the dorsal lithotomy position, normal external genitalia was noted.  A speculum was placed in the patient's vagina, normal discharge was noted, no lesions. The cervix was visualized, no lesions, no abnormal discharge.  The strings of the IUD were grasped and pulled using ring forceps. The IUD was removed in its entirety.    Patient tolerated the procedure well.   UD Insertion Procedure Note Patient identified, informed consent performed, consent signed.   Discussed risks of irregular bleeding, cramping, infection, malpositioning or misplacement of the IUD outside the uterus which may require further procedure such as laparoscopy. Time out was performed.    Cervix visualized.  Cleaned with Betadine x 2.  Grasped anteriorly with a single tooth tenaculum.  Uterus sounded to 8  cm.  Liletta IUD placed per manufacturer's recommendations.  Strings trimmed to 3 cm. Tenaculum was removed, good hemostasis noted.  Patient tolerated procedure well.   Patient was given post-procedure instructions.  She was advised to have backup contraception for one week.  Patient was also asked to check IUD strings periodically and follow up in 4 weeks for IUD check.   Scheryl Darter, MD, FACOG Attending Obstetrician & Gynecologist, [facility], [facility]

## 2020-12-21 ENCOUNTER — Other Ambulatory Visit: Payer: Self-pay | Admitting: Internal Medicine

## 2020-12-21 DIAGNOSIS — Z1231 Encounter for screening mammogram for malignant neoplasm of breast: Secondary | ICD-10-CM

## 2020-12-21 LAB — CYTOLOGY - PAP
Adequacy: ABSENT
Chlamydia: NEGATIVE
Comment: NEGATIVE
Comment: NEGATIVE
Comment: NORMAL
Diagnosis: NEGATIVE
High risk HPV: NEGATIVE
Neisseria Gonorrhea: NEGATIVE

## 2021-01-17 ENCOUNTER — Ambulatory Visit: Payer: Medicare Other

## 2021-01-17 ENCOUNTER — Ambulatory Visit: Payer: Medicare Other | Admitting: Obstetrics and Gynecology

## 2021-01-26 ENCOUNTER — Ambulatory Visit
Admission: RE | Admit: 2021-01-26 | Discharge: 2021-01-26 | Disposition: A | Discharge status: Home / Self Care | Payer: Medicare Other | Source: Ambulatory Visit | Attending: Internal Medicine | Admitting: Internal Medicine

## 2021-01-26 ENCOUNTER — Other Ambulatory Visit: Payer: Self-pay

## 2021-01-26 DIAGNOSIS — Z1231 Encounter for screening mammogram for malignant neoplasm of breast: Secondary | ICD-10-CM

## 2021-02-07 ENCOUNTER — Ambulatory Visit (INDEPENDENT_AMBULATORY_CARE_PROVIDER_SITE_OTHER): Payer: Medicare Other | Admitting: Family Medicine

## 2021-02-07 ENCOUNTER — Other Ambulatory Visit: Payer: Self-pay

## 2021-02-07 VITALS — BP 118/73 | HR 87

## 2021-02-07 DIAGNOSIS — Z30431 Encounter for routine checking of intrauterine contraceptive device: Secondary | ICD-10-CM | POA: Diagnosis not present

## 2021-02-07 NOTE — Progress Notes (Signed)
   GYNECOLOGY OFFICE VISIT NOTE  History:  45 y.o. G0P0 here today for IUD string check.Liletta IUD was placed on 10/24 for help with management of periods in setting of her disability  She denies any abnormal vaginal discharge, bleeding, pelvic pain or other concerns.   The following portions of the patient's history were reviewed and updated as appropriate: allergies, current medications, past family history, past medical history, past social history, past surgical history and problem list.    Review of Systems:  Pertinent items noted in HPI Review of Systems  All other systems reviewed and are negative.  Objective:  Physical Exam BP 118/73   Pulse 87  Physical Exam Vitals and nursing note reviewed.  Cardiovascular:     Rate and Rhythm: Normal rate.     Pulses: Normal pulses.  Pulmonary:     Effort: Pulmonary effort is normal.  Genitourinary:    Comments: Speculum exam done with small speculum. Purple IUD strings visualized in os Neurological:     Mental Status: She is alert.     Assessment & Plan:  1. IUD check up Strings visualized and in place in cervix. - Follow up PRN   Routine preventative health maintenance measures emphasized. Please refer to After Visit Summary for other counseling recommendations.    Total face-to-face time with patient: 10 minutes.  Over 50% of encounter was spent on counseling and coordination of care.  Warner Mccreedy, MD, MPH OB Fellow, Faculty [facility] for [facility], [facility]

## 2021-07-09 LAB — EXTERNAL GENERIC LAB PROCEDURE

## 2021-07-09 LAB — COLOGUARD

## 2021-09-20 ENCOUNTER — Ambulatory Visit (INDEPENDENT_AMBULATORY_CARE_PROVIDER_SITE_OTHER): Payer: Medicare Other | Admitting: Pulmonary Disease

## 2021-09-20 ENCOUNTER — Encounter: Payer: Self-pay | Admitting: Pulmonary Disease

## 2021-09-20 VITALS — BP 124/68 | HR 60 | Temp 98.4°F | Wt 155.0 lb

## 2021-09-20 DIAGNOSIS — J4530 Mild persistent asthma, uncomplicated: Secondary | ICD-10-CM | POA: Diagnosis not present

## 2021-09-20 DIAGNOSIS — G809 Cerebral palsy, unspecified: Secondary | ICD-10-CM

## 2021-09-20 NOTE — Patient Instructions (Signed)
Thank you for visiting Dr. Tonia Brooms at [facility]. Today we recommend the following:  Albuterol nebulizer solution to use as needed  Ok to discontinue pulmicort from medication list as this time.   Return in about 1 year (around 09/21/2022), or if symptoms worsen or fail to improve, for with APP or Dr. Tonia Brooms.    Please do your part to reduce the spread of COVID-19.
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
## 2022-02-02 ENCOUNTER — Other Ambulatory Visit: Payer: Self-pay | Admitting: Internal Medicine

## 2022-02-02 DIAGNOSIS — Z1231 Encounter for screening mammogram for malignant neoplasm of breast: Secondary | ICD-10-CM

## 2022-02-06 ENCOUNTER — Emergency Department (HOSPITAL_COMMUNITY)
Admission: EM | Admit: 2022-02-06 | Discharge: 2022-02-06 | Disposition: A | Discharge status: Home / Self Care | Payer: Medicare Other | Attending: Emergency Medicine | Admitting: Emergency Medicine

## 2022-02-06 ENCOUNTER — Other Ambulatory Visit: Payer: Self-pay

## 2022-02-06 ENCOUNTER — Encounter (HOSPITAL_COMMUNITY): Payer: Self-pay

## 2022-02-06 DIAGNOSIS — W050XXA Fall from non-moving wheelchair, initial encounter: Secondary | ICD-10-CM | POA: Diagnosis not present

## 2022-02-06 DIAGNOSIS — S0181XA Laceration without foreign body of other part of head, initial encounter: Secondary | ICD-10-CM | POA: Insufficient documentation

## 2022-02-06 DIAGNOSIS — S0990XA Unspecified injury of head, initial encounter: Secondary | ICD-10-CM

## 2022-02-06 MED ORDER — BACITRACIN ZINC 500 UNIT/GM EX OINT: TOPICAL_OINTMENT | Freq: Two times a day (BID) | CUTANEOUS | Status: DC

## 2022-02-06 MED ORDER — BACITRACIN ZINC 500 UNIT/GM EX OINT: 1.0000 | TOPICAL_OINTMENT | Freq: Two times a day (BID) | CUTANEOUS | 0 refills | Status: DC

## 2022-02-06 NOTE — Discharge Instructions (Signed)
You came to the emergency department today because [name] fell out of her wheelchair.  She has a small abrasion to her right eyebrow.  This does not require any stitches however you should keep it clean with warm water and mild soap.  You may use bacitracin on the wound in the morning and at night after cleaning.  Keep it covered throughout the day if she is out and about but it may be open to the air if she is resting in the group home.  Bacitracin is something you may buy over-the-counter.  It is similar to Neosporin.  I have attached a prescription if it is needed for the group home.  It was a pleasure to meet you guys and I hope she feels better.  Do not hesitate to return to the emergency department with any worsening symptoms or changes in mental status.  Otherwise, Tylenol and ibuprofen are good options for pain.

## 2022-02-06 NOTE — ED Triage Notes (Signed)
Pt reports with a fall from this evening. Pt leaned out of her wheelchair and fell over hitting her right eyebrow. Pt has a knot to her right eyebrow and a small laceration.

## 2022-02-06 NOTE — ED Provider Notes (Signed)
[facility]-EMERGENCY DEPT Provider Note   CSN: [id number] Arrival date & time: 02/06/22  2035     History  Chief Complaint  Patient presents with   Fall   Laceration    [name] is a 46 y.o. female presenting with caretaker from group home after a fall.  Patient was leaning over in her wheelchair and it tilted and she hit her head on the floor.  No loss of consciousness.  No blood thinners.  Patient denies any pain and she is not having blurred vision, photophobia, weakness different from baseline due to her PMH of cerebral palsy   Fall  Laceration      Home Medications Prior to Admission medications   Medication Sig Start Date End Date Taking? Authorizing Provider  acetaminophen (TYLENOL) 650 MG CR tablet Take 1 tablet (650 mg total) by mouth every 8 (eight) hours as needed. DO NOT EXCEED 4 GRAMS DAILY Patient not taking: Reported on 09/20/2021 08/06/12   Elyn Peers S, DO  albuterol (PROVENTIL) (2.5 MG/3ML) 0.083% nebulizer solution Take 3 mLs (2.5 mg total) by nebulization every 4 (four) hours as needed for wheezing or shortness of breath. 09/23/20   Icard, Elige Radon L, DO  albuterol (VENTOLIN HFA) 108 (90 Base) MCG/ACT inhaler Inhale 1-2 puffs into the lungs every 6 (six) hours as needed for wheezing or shortness of breath.    [provider]  baclofen (LIORESAL) 10 MG tablet Take 1 tablet (10 mg total) by mouth 3 (three) times daily. 01/28/14   Emokpae, Ejiroghene E, MD  Brompheniramine-Phenylephrine 1-2.5 MG/5ML syrup Dimetapp Cold-Allergy (PE) 1 mg-2.5 mg/5 mL oral solution  Take by oral route.    [provider]  calcium-vitamin D (OSCAL WITH D) 500-200 MG-UNIT per tablet Take 1 tablet by mouth daily.   Patient not taking: Reported on 09/20/2021    [provider]  CarBAMazepine (TEGRETOL PO) Take 200 mg by mouth 2 (two) times daily. Take 1 & 1/2 tab BID Patient not taking: Reported on 09/20/2021    [provider]  cetirizine (ZYRTEC) 10 MG tablet Take 1 tablet (10 mg total) by mouth daily. 06/02/10   Kalia-Reynolds, Maitri S, DO  diphenhydrAMINE (BENADRYL) 25 mg capsule Take 25 mg by mouth at bedtime as needed.      [provider]  ezetimibe (ZETIA) 10 MG tablet Take 10 mg by mouth daily.    [provider]  fluticasone (FLONASE) 50 MCG/ACT nasal spray Place 2 sprays into the nose daily. 07/04/12 09/13/13  Kalia-Reynolds, Maitri S, DO  fluticasone (FLOVENT HFA) 110 MCG/ACT inhaler Inhale into the lungs 2 (two) times daily. Patient not taking: Reported on 09/20/2021    [provider]  gabapentin (NEURONTIN) 400 MG capsule Take 400 mg by mouth daily. 3 caps at bedtime po     [provider]  hydrocortisone cream 1 % Apply topically 2 (two) times daily as needed for itching (For itching). Apply to affected area 2 times daily as needed. Patient not taking: Reported on 09/20/2021 04/06/16   Thomasene Lot, MD  ipratropium-albuterol (DUONEB) 0.5-2.5 (3) MG/3ML SOLN Take 3 mLs by nebulization every 6 (six) hours as needed. Patient not taking: Reported on 09/20/2021    [provider]  loperamide (IMODIUM A-D) 2 MG tablet Anti-Diarrheal (loperamide) 2 mg tablet  Take by oral route. Patient not taking: Reported on 09/20/2021    [provider]  LORazepam (ATIVAN) 0.5 MG tablet Take 0.5 mg by mouth every 4 (  four) hours as needed. 01/25/16   [provider]  LORazepam (ATIVAN) 1 MG tablet Take 1 mg by mouth 2 (two) times daily. 10/18/16   [provider]  Lurasidone HCl (LATUDA PO) Take 60 mg by mouth.    [provider]  metFORMIN (GLUMETZA) 500 MG (MOD) 24 hr tablet Take 500 mg by mouth daily with breakfast.    [provider]  risperiDONE (RISPERDAL) 0.5 MG tablet Take 1 tablet (0.5 mg total) by mouth at bedtime. Patient not taking: Reported on 09/23/2020 07/14/14   Bethena Roys, MD  rosuvastatin (CRESTOR) 40 MG  tablet Take 40 mg by mouth daily.    [provider]  senna (SENOKOT) 8.6 MG TABS tablet Take 1 tablet by mouth daily as needed for mild constipation.    [provider]  sertraline (ZOLOFT) 100 MG tablet Take 100 mg by mouth daily.      [provider]  triamcinolone cream (KENALOG) 0.5 % triamcinolone acetonide 0.5 % topical cream  APPLY A THIN LAYER TO THE AFFECTED AREA(S) BY TOPICAL ROUTE AS NEEDED    [provider]      Allergies    Phenobarbital and Phenytoin    Review of Systems   Review of Systems  Physical Exam Updated Vital Signs BP 117/72   Pulse 71   Temp 97.6 F (36.4 C) (Oral)   Resp 17   Ht 5\' 4"  (1.626 m)   Wt 70.3 kg   SpO2 99%   BMI 26.60 kg/m  Physical Exam Vitals and nursing note reviewed.  Constitutional:      General: She is not in acute distress.    Appearance: Normal appearance. She is not ill-appearing.  HENT:     Head: Normocephalic and atraumatic.     Nose: Nose normal.  Eyes:     General: No scleral icterus.    Extraocular Movements: Extraocular movements intact.     Conjunctiva/sclera: Conjunctivae normal.     Pupils: Pupils are equal, round, and reactive to light.  Cardiovascular:     Rate and Rhythm: Normal rate and regular rhythm.  Pulmonary:     Effort: Pulmonary effort is normal. No respiratory distress.     Breath sounds: No wheezing.  Musculoskeletal:     Comments: Full range of motion of right upper extremity.  Full strength to finger grip on the right side.  Patient does not use her left upper extremity secondary to cerebral palsy.  Skin:    General: Skin is warm and dry.     Findings: Bruising present. No rash.     Comments: 0.5 cm uvular laceration to the right brow.  Mild bruising to the area.  Bleeding controlled  Neurological:     Mental Status: She is alert.     Comments: No facial droop.  Mildly slurred speech which caretaker says is her baseline  Psychiatric:        Mood and Affect:  Mood normal.     ED Results / Procedures / Treatments   Labs (all labs ordered are listed, but only abnormal results are displayed) Labs Reviewed - No data to display  EKG None  Radiology No results found.  Procedures Procedures   Medications Ordered in ED Medications - No data to display  ED Course/ Medical Decision Making/ A&P                           Medical Decision Making Risk  OTC drugs.   46 year old female presenting today from her group home after bending over and tipping over her wheelchair.  She has a small laceration to her right brow however this is very superficial and does not require any repair.  Did consider trying to put 1 stitch in the area however do not believe it is indicated and the tissue swelling would likely make this repair unsuccessful.  Consider Dermabond or Steri-Strips however the area is so superficial and small I do not believe this is indicated at this time.  She will be given bacitracin and a wound covering.  Prescription for bacitracin and instructions for wound care will be given to the patient's caretaker and she will be discharged home at this time.  No findings on physical exam concerning for intracranial hemorrhage and caretaker reports that she is mentating at her baseline.  Return precautions were discussed and attached to patient's discharge papers.  Final Clinical Impression(s) / ED Diagnoses Final diagnoses:  Facial laceration, initial encounter    Rx / DC Orders ED Discharge Orders          Ordered    bacitracin ointment  2 times daily        02/06/22 2134           Results and diagnoses were explained to the patient and her caretaker. Return precautions discussed in full.  They had no additional questions and expressed complete understanding.   This chart was dictated using voice recognition software.  Despite best efforts to proofread,  errors can occur which can change the documentation meaning.    Darliss Ridgel 02/06/22 2156    Malvin Johns, MD 02/06/22 [phone]

## 2022-03-09 ENCOUNTER — Ambulatory Visit
Admission: RE | Admit: 2022-03-09 | Discharge: 2022-03-09 | Disposition: A | Discharge status: Home / Self Care | Payer: Medicare Other | Source: Ambulatory Visit | Attending: Internal Medicine | Admitting: Internal Medicine

## 2022-03-09 DIAGNOSIS — Z1231 Encounter for screening mammogram for malignant neoplasm of breast: Secondary | ICD-10-CM

## 2022-04-10 ENCOUNTER — Ambulatory Visit (INDEPENDENT_AMBULATORY_CARE_PROVIDER_SITE_OTHER): Payer: Medicare Other | Admitting: Podiatry

## 2022-04-10 ENCOUNTER — Encounter: Payer: Self-pay | Admitting: Podiatry

## 2022-04-10 DIAGNOSIS — M79674 Pain in right toe(s): Secondary | ICD-10-CM | POA: Diagnosis not present

## 2022-04-10 DIAGNOSIS — M79675 Pain in left toe(s): Secondary | ICD-10-CM | POA: Diagnosis not present

## 2022-04-10 DIAGNOSIS — B351 Tinea unguium: Secondary | ICD-10-CM | POA: Diagnosis not present
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
## 2022-07-10 ENCOUNTER — Ambulatory Visit (INDEPENDENT_AMBULATORY_CARE_PROVIDER_SITE_OTHER): Payer: Medicare Other | Admitting: Podiatry

## 2022-07-10 DIAGNOSIS — Z91199 Patient's noncompliance with other medical treatment and regimen due to unspecified reason: Secondary | ICD-10-CM

## 2022-07-10 NOTE — Progress Notes (Signed)
No show

## 2022-07-30 IMAGING — MG MM DIGITAL SCREENING BILAT W/ TOMO AND CAD
8 series · 8 of 24 positions shown · non-contrast
Comparison: Previous exam(s).

CLINICAL DATA: Screening.

EXAM:
DIGITAL SCREENING BILATERAL MAMMOGRAM WITH TOMOSYNTHESIS AND CAD
TECHNIQUE: Bilateral screening digital craniocaudal and mediolateral oblique
mammograms were obtained. Bilateral screening digital breast
tomosynthesis was performed. The images were evaluated with
computer-aided detection.

[L CC synth-2D]
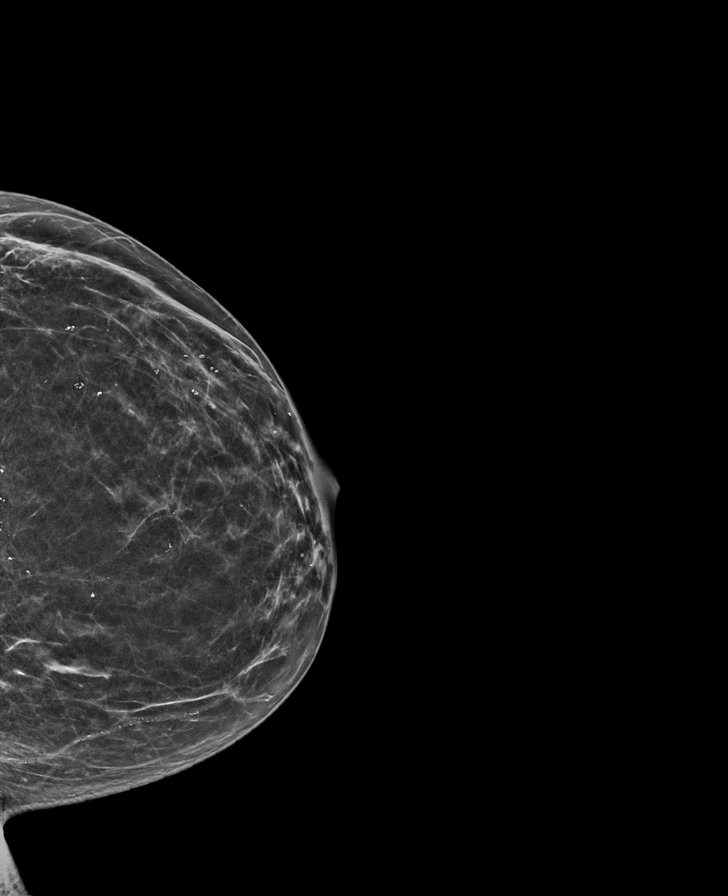

[R MLO synth-2D]
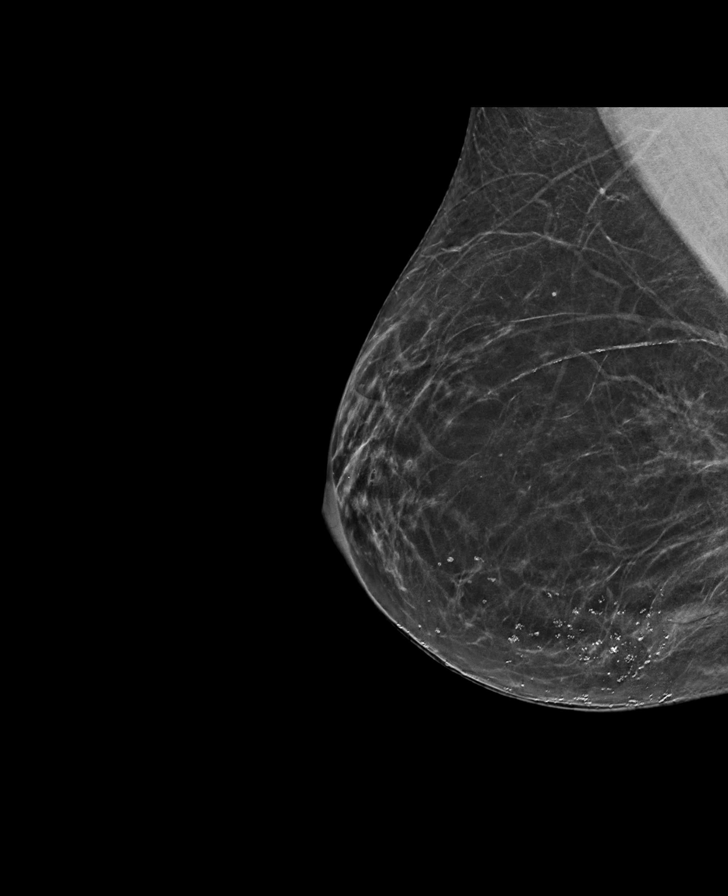

[L MLO synth-2D]
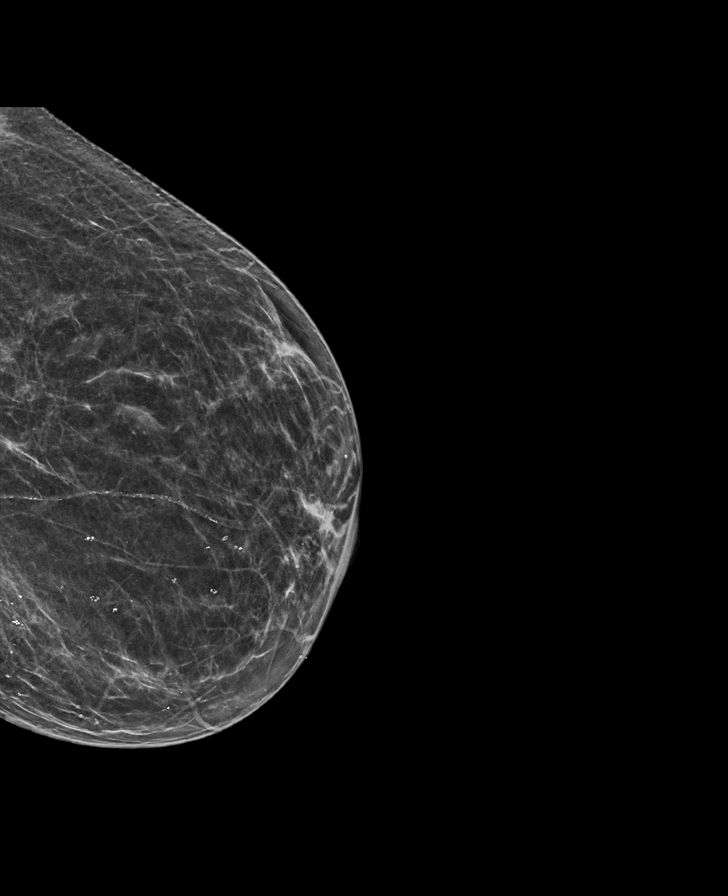

[R CC synth-2D]
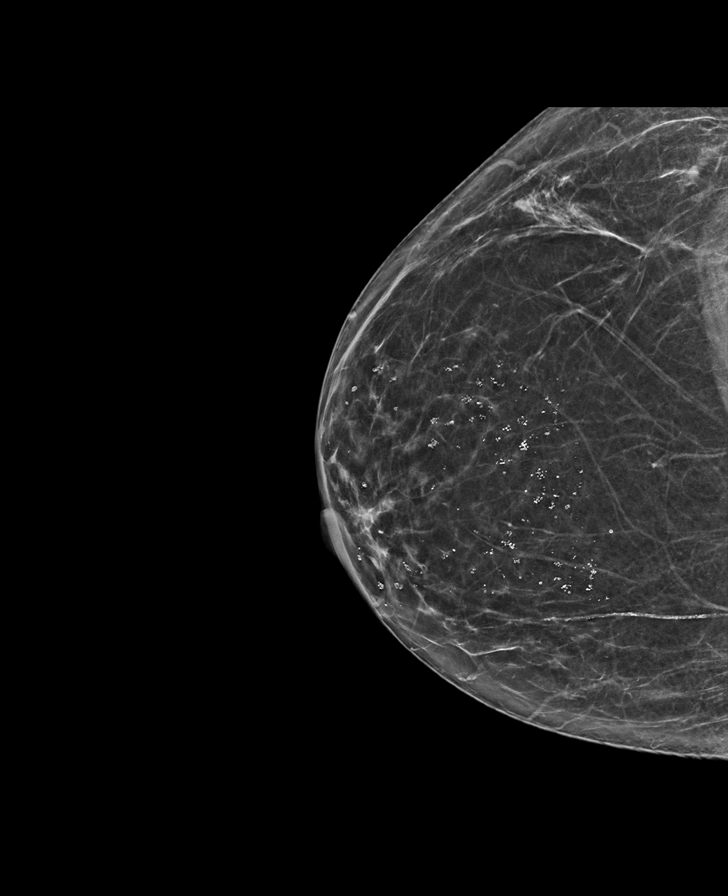

[L CC tomo · tomo slice 28/55.0]
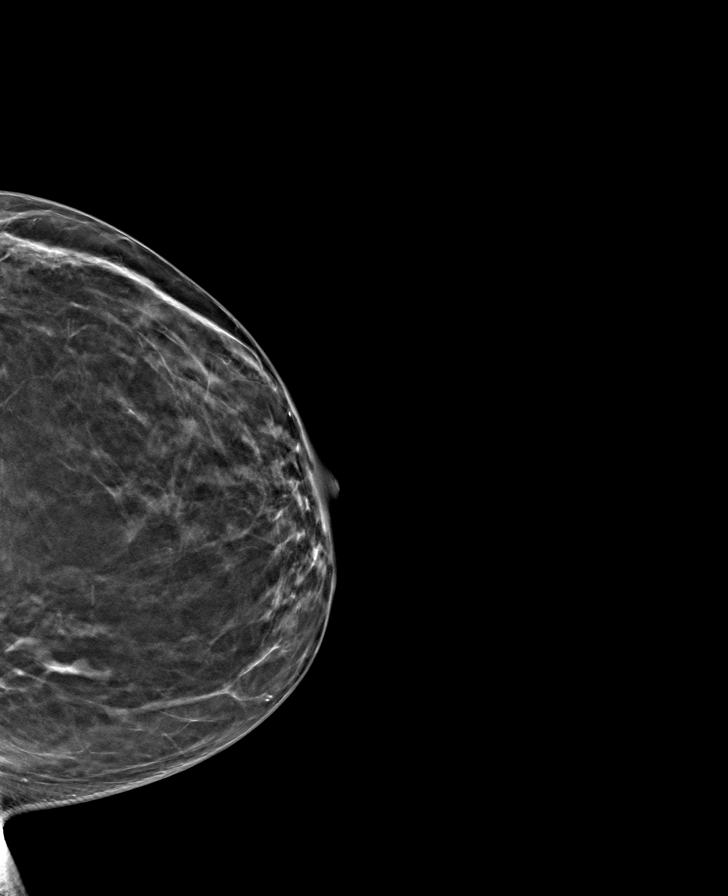

[L MLO tomo · tomo slice 31/60.0]
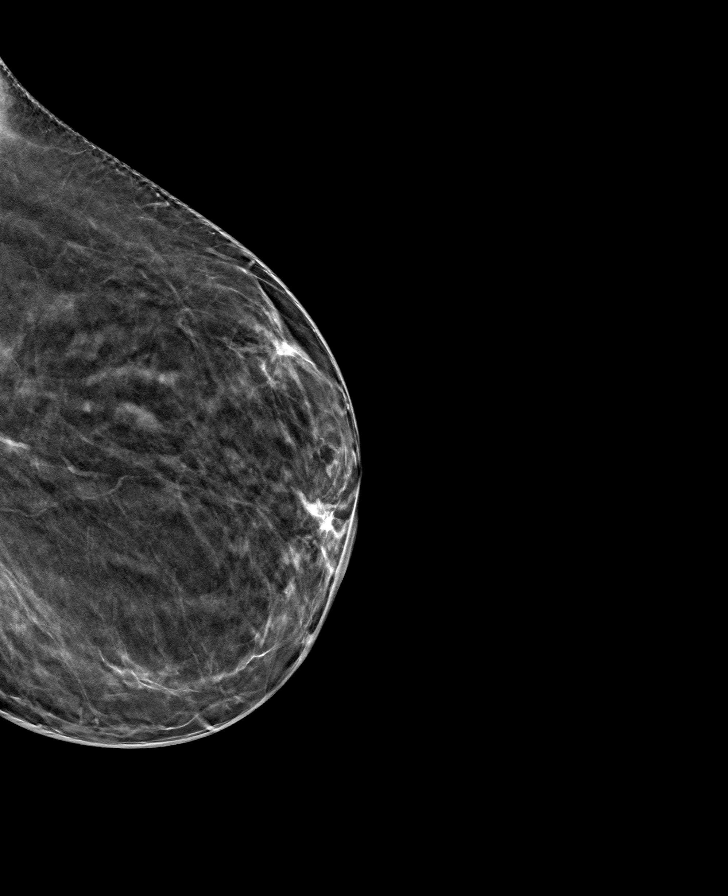

[R MLO tomo · tomo slice 32/63.0]
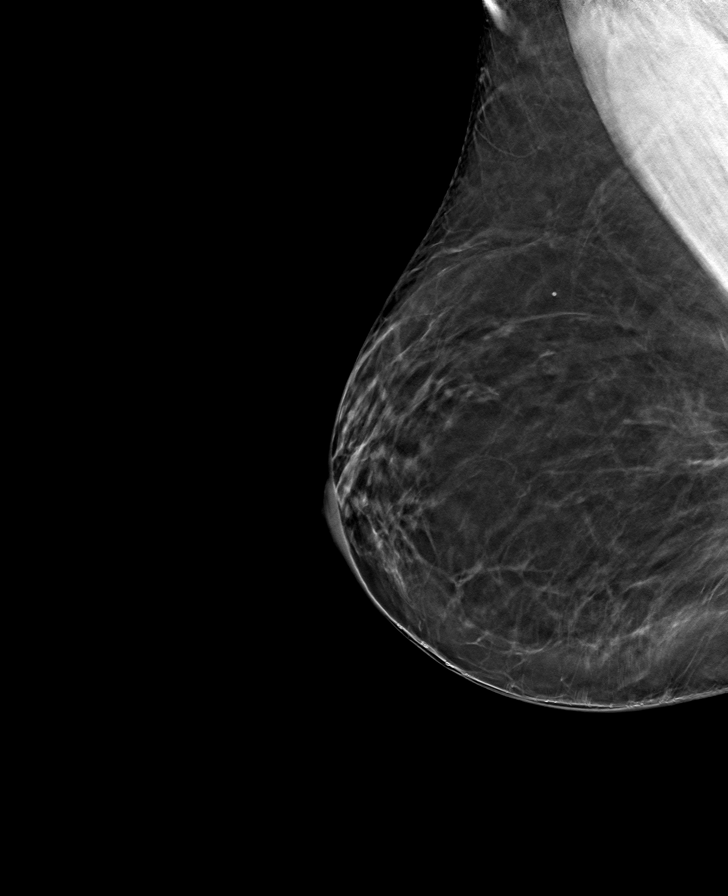

[R CC tomo · tomo slice 31/61.0]
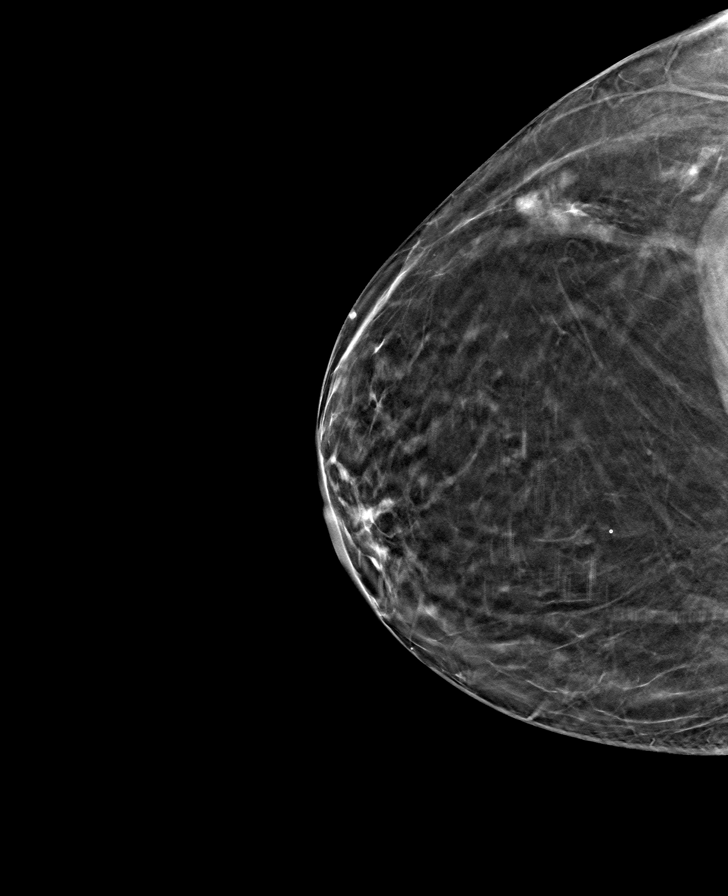

[8 of 24 positions shown; findings below may reference images not displayed]

ACR Breast Density Category b: There are scattered areas of
fibroglandular density.
FINDINGS: There are no findings suspicious for malignancy.
IMPRESSION: No mammographic evidence of malignancy. A result letter of this
screening mammogram will be mailed directly to the patient.

RECOMMENDATION:
Screening mammogram in one year. (Code:51-O-LD2)

BI-RADS CATEGORY  1: Negative.

## 2023-02-03 ENCOUNTER — Other Ambulatory Visit: Payer: Self-pay

## 2023-02-03 ENCOUNTER — Encounter (HOSPITAL_COMMUNITY): Payer: Self-pay

## 2023-02-03 ENCOUNTER — Emergency Department (HOSPITAL_COMMUNITY): Payer: Medicare Other

## 2023-02-03 ENCOUNTER — Emergency Department (HOSPITAL_COMMUNITY)
Admission: EM | Admit: 2023-02-03 | Discharge: 2023-02-03 | Disposition: A | Discharge status: Home / Self Care | Payer: Medicare Other | Attending: Emergency Medicine | Admitting: Emergency Medicine

## 2023-02-03 DIAGNOSIS — R109 Unspecified abdominal pain: Secondary | ICD-10-CM | POA: Diagnosis not present

## 2023-02-03 DIAGNOSIS — R0789 Other chest pain: Secondary | ICD-10-CM | POA: Diagnosis present

## 2023-02-03 LAB — CBC WITH DIFFERENTIAL/PLATELET
Abs Immature Granulocytes: 0.02 10*3/uL (ref 0.00–0.07)
Basophils Absolute: 0 10*3/uL (ref 0.0–0.1)
Basophils Relative: 1 %
Eosinophils Absolute: 0.2 10*3/uL (ref 0.0–0.5)
Eosinophils Relative: 3 %
HCT: 38.1 % (ref 36.0–46.0)
Hemoglobin: 12.2 g/dL (ref 12.0–15.0)
Immature Granulocytes: 0 %
Lymphocytes Relative: 35 %
Lymphs Abs: 2.3 10*3/uL (ref 0.7–4.0)
MCH: 26.5 pg (ref 26.0–34.0)
MCHC: 32 g/dL (ref 30.0–36.0)
MCV: 82.8 fL (ref 80.0–100.0)
Monocytes Absolute: 0.5 10*3/uL (ref 0.1–1.0)
Monocytes Relative: 7 %
Neutro Abs: 3.6 10*3/uL (ref 1.7–7.7)
Neutrophils Relative %: 54 %
Platelets: 353 10*3/uL (ref 150–400)
RBC: 4.6 MIL/uL (ref 3.87–5.11)
RDW: 13 % (ref 11.5–15.5)
WBC: 6.5 10*3/uL (ref 4.0–10.5)
nRBC: 0 % (ref 0.0–0.2)

## 2023-02-03 LAB — COMPREHENSIVE METABOLIC PANEL
ALT: 16 U/L (ref 0–44)
AST: 21 U/L (ref 15–41)
Albumin: 3.5 g/dL (ref 3.5–5.0)
Alkaline Phosphatase: 65 U/L (ref 38–126)
Anion gap: 9 (ref 5–15)
BUN: 6 mg/dL (ref 6–20)
CO2: 29 mmol/L (ref 22–32)
Calcium: 9.2 mg/dL (ref 8.9–10.3)
Chloride: 100 mmol/L (ref 98–111)
Creatinine, Ser: 0.45 mg/dL (ref 0.44–1.00)
GFR, Estimated: >60 mL/min (ref >60)
Glucose, Bld: 119 mg/dL — ABNORMAL HIGH (ref 70–99)
Potassium: 4 mmol/L (ref 3.5–5.1)
Sodium: 138 mmol/L (ref 135–145)
Total Bilirubin: 0.5 mg/dL (ref <1.2)
Total Protein: 6.8 g/dL (ref 6.5–8.1)

## 2023-02-03 LAB — LIPASE, BLOOD: Lipase: 30 U/L (ref 11–51)

## 2023-02-03 LAB — TROPONIN I (HIGH SENSITIVITY): Troponin I (High Sensitivity): <2 ng/L (ref <18)

## 2023-02-03 LAB — D-DIMER, QUANTITATIVE: D-Dimer, Quant: 0.39 ug{FEU}/mL (ref 0.00–0.50)

## 2023-02-03 MED ORDER — DICLOFENAC SODIUM 1 % EX GEL: 2.0000 g | Freq: Four times a day (QID) | CUTANEOUS | 0 refills | Status: AC
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
## 2023-02-03 NOTE — Discharge Instructions (Signed)
You were seen in the emerged part today with chest discomfort.  I suspect this is coming from the muscles and bones in your chest wall.  Your labs are reassuring.  You may take Tylenol as needed for pain and I have given a paper prescription for Voltaren gel which is a topical pain relieving medicine.  Please follow with your primary care doctor and return with any new or suddenly worsening symptoms.

## 2023-03-01 ENCOUNTER — Other Ambulatory Visit: Payer: Self-pay | Admitting: Internal Medicine

## 2023-03-01 DIAGNOSIS — Z1231 Encounter for screening mammogram for malignant neoplasm of breast: Secondary | ICD-10-CM

## 2023-03-16 ENCOUNTER — Ambulatory Visit
Admission: RE | Admit: 2023-03-16 | Discharge: 2023-03-16 | Disposition: A | Discharge status: Home / Self Care | Payer: Medicare Other | Source: Ambulatory Visit | Attending: Internal Medicine | Admitting: Internal Medicine

## 2023-03-16 DIAGNOSIS — Z1231 Encounter for screening mammogram for malignant neoplasm of breast: Secondary | ICD-10-CM

## 2023-04-12 ENCOUNTER — Ambulatory Visit
Admission: RE | Admit: 2023-04-12 | Discharge: 2023-04-12 | Disposition: A | Discharge status: Home / Self Care | Payer: Medicare Other | Source: Ambulatory Visit | Attending: Family Medicine | Admitting: Family Medicine

## 2023-04-12 ENCOUNTER — Other Ambulatory Visit: Payer: Self-pay | Admitting: Family Medicine

## 2023-04-12 DIAGNOSIS — M79675 Pain in left toe(s): Secondary | ICD-10-CM

## 2023-05-29 NOTE — Progress Notes (Unsigned)
 GYNECOLOGY ANNUAL PREVENTATIVE CARE ENCOUNTER NOTE  Subjective:   [name] is a 48 y.o. G0P0 female here for a routine annual gynecologic exam.  Current complaints: none  Menses are not present-- IUD has led to amenorrhea Likes IUD, desire to continue.  Does not have hot flashes or other menopausal sx.  Wheelchair bound caregiver present  Denies abnormal vaginal bleeding, discharge, pelvic pain, problems with intercourse or other gynecologic concerns.    Gynecologic History No LMP recorded. (Menstrual status: IUD). Contraception: IUD Last Pap: . Results were: normal Last mammogram: Jan 2025. Results were: normal  Health Maintenance Due  Topic Date Due   Medicare Annual Wellness (AWV)  Never done   Pneumococcal Vaccine 20-12 Years old (1 of 2 - PCV) Never done   HIV Screening  Never done   Hepatitis C Screening  Never done   Colonoscopy  Never done   COVID-19 Vaccine (1 - 2024-25 season) Never done    The following portions of the patient's history were reviewed and updated as appropriate: allergies, current medications, past family history, past medical history, past social history, past surgical history and problem list.  Review of Systems Pertinent items are noted in HPI.   Objective:  BP 119/79   Pulse (!) 118  CONSTITUTIONAL: Well-developed, well-nourished female in no acute distress.  HENT:  Normocephalic, atraumatic, External right and left ear normal. Oropharynx is clear and moist EYES:  No scleral icterus.  NECK: Normal range of motion, supple, no masses.  Normal thyroid.  SKIN: Skin is warm and dry. No rash noted. Not diaphoretic. No erythema. No pallor. NEUROLOGIC: Alert and oriented to person, place, and time. Normal reflexes, muscle tone coordination. No cranial nerve deficit noted. PSYCHIATRIC: Normal mood and affect. Normal behavior. Normal judgment and thought content. CARDIOVASCULAR: Normal heart rate noted, regular rhythm. 2+ distal  pulses. RESPIRATORY: Effort and breath sounds normal, no problems with respiration noted. BREASTS: Symmetric in size. No masses, skin changes, nipple drainage, or lymphadenopathy. ABDOMEN: Soft,  no distention noted.  No tenderness, rebound or guarding.  PELVIC: Normal appearing external genitalia; normal appearing vaginal mucosa and cervix.  No abnormal discharge noted.  Able to see IUD strings.  Normal uterine size, no other palpable masses, no uterine or adnexal tenderness. Chaperone present for exam MUSCULOSKELETAL: Normal range of motion.   Assessment and Plan:  1) Annual gynecologic examination with pap smear:  Will follow up results of pap smear and manage accordingly. STI screening desired No.  Routine preventative health maintenance measures emphasized. Reviewed perimenopausal symptoms and management.   2) Contraception counseling: Reviewed all forms of birth control options available including abstinence; over the counter/barrier methods; hormonal contraceptive medication including pill, patch, ring, injection,contraceptive implant; hormonal and nonhormonal IUDs; permanent sterilization options including vasectomy and the various tubal sterilization modalities. Risks and benefits reviewed.  Questions were answered.  Written information was also given to the patient to review.  Patient desires IUD, this was prescribed for patient. She will follow up in  1 year for surveillance.  She was told to call with any further questions, or with any concerns about this method of contraception.  Emphasized use of condoms 100% of the time for STI prevention.  1. Infantile cerebral palsy (HCC)  2. Well woman exam with routine gynecological exam (Primary) - Cytology - PAP( [place])  3. Vaginal discharge - Cervicovaginal ancillary only    Please refer to After Visit Summary for other counseling recommendations.   Return in about 1  year (around 05/29/2024) for Yearly wellness exam.  Federico Flake, MD, MPH, ABFM Attending Physician [facility]

## 2023-05-30 ENCOUNTER — Other Ambulatory Visit (HOSPITAL_COMMUNITY)
Admission: RE | Admit: 2023-05-30 | Discharge: 2023-05-30 | Disposition: A | Discharge status: Home / Self Care | Source: Ambulatory Visit | Attending: Family Medicine | Admitting: Family Medicine

## 2023-05-30 ENCOUNTER — Ambulatory Visit (INDEPENDENT_AMBULATORY_CARE_PROVIDER_SITE_OTHER): Payer: Medicare Other | Admitting: Family Medicine

## 2023-05-30 VITALS — BP 119/79 | HR 118

## 2023-05-30 DIAGNOSIS — N898 Other specified noninflammatory disorders of vagina: Secondary | ICD-10-CM

## 2023-05-30 DIAGNOSIS — G809 Cerebral palsy, unspecified: Secondary | ICD-10-CM

## 2023-05-30 DIAGNOSIS — Z0000 Encounter for general adult medical examination without abnormal findings: Secondary | ICD-10-CM | POA: Diagnosis not present

## 2023-05-30 DIAGNOSIS — Z01419 Encounter for gynecological examination (general) (routine) without abnormal findings: Secondary | ICD-10-CM | POA: Insufficient documentation

## 2023-05-30 DIAGNOSIS — Z1151 Encounter for screening for human papillomavirus (HPV): Secondary | ICD-10-CM | POA: Diagnosis not present

## 2023-05-31 LAB — CERVICOVAGINAL ANCILLARY ONLY
Bacterial Vaginitis (gardnerella): NEGATIVE
Candida Glabrata: NEGATIVE
Candida Vaginitis: NEGATIVE
Comment: NEGATIVE
Comment: NEGATIVE
Comment: NEGATIVE

## 2023-06-04 LAB — CYTOLOGY - PAP
Comment: NEGATIVE
Diagnosis: NEGATIVE
High risk HPV: NEGATIVE

## 2023-06-05 ENCOUNTER — Telehealth: Payer: Self-pay

## 2023-06-05 NOTE — Telephone Encounter (Addendum)
-----   Message from Federico Flake sent at 06/04/2023 11:36 AM EDT ----- NIL, HPV negative. Next pap in 5 years.  Please call patient. She has not MyChart and relies on care givers being given her results.   Attempted to contact pt unable to leave voicemail as message stating "the operator is not available".    Addison Naegeli, RN  06/05/23

## 2023-06-06 NOTE — Telephone Encounter (Signed)
 Attempted to call patient, no one answered. Received message that operator not available.   Called alternate number in chart. No one answered and no voicemail. Will send letter to patient today.

## 2024-02-13 ENCOUNTER — Other Ambulatory Visit: Payer: Self-pay | Admitting: Internal Medicine

## 2024-02-13 DIAGNOSIS — Z1231 Encounter for screening mammogram for malignant neoplasm of breast: Secondary | ICD-10-CM

## 2024-02-26 ENCOUNTER — Encounter: Payer: Self-pay | Admitting: Internal Medicine

## 2024-03-06 ENCOUNTER — Observation Stay (HOSPITAL_COMMUNITY)
Admission: EM | Admit: 2024-03-06 | Discharge: 2024-03-07 | Disposition: A | Discharge status: Home / Self Care | Attending: Internal Medicine | Admitting: Internal Medicine

## 2024-03-06 ENCOUNTER — Emergency Department (HOSPITAL_COMMUNITY)

## 2024-03-06 ENCOUNTER — Other Ambulatory Visit: Payer: Self-pay

## 2024-03-06 DIAGNOSIS — J309 Allergic rhinitis, unspecified: Secondary | ICD-10-CM | POA: Insufficient documentation

## 2024-03-06 DIAGNOSIS — R471 Dysarthria and anarthria: Secondary | ICD-10-CM | POA: Insufficient documentation

## 2024-03-06 DIAGNOSIS — Z8669 Personal history of other diseases of the nervous system and sense organs: Secondary | ICD-10-CM | POA: Diagnosis not present

## 2024-03-06 DIAGNOSIS — J45909 Unspecified asthma, uncomplicated: Secondary | ICD-10-CM | POA: Insufficient documentation

## 2024-03-06 DIAGNOSIS — Z7984 Long term (current) use of oral hypoglycemic drugs: Secondary | ICD-10-CM | POA: Diagnosis not present

## 2024-03-06 DIAGNOSIS — G40909 Epilepsy, unspecified, not intractable, without status epilepticus: Secondary | ICD-10-CM | POA: Diagnosis not present

## 2024-03-06 DIAGNOSIS — G800 Spastic quadriplegic cerebral palsy: Secondary | ICD-10-CM | POA: Insufficient documentation

## 2024-03-06 DIAGNOSIS — Z87820 Personal history of traumatic brain injury: Secondary | ICD-10-CM

## 2024-03-06 DIAGNOSIS — R29810 Facial weakness: Secondary | ICD-10-CM | POA: Insufficient documentation

## 2024-03-06 DIAGNOSIS — R410 Disorientation, unspecified: Secondary | ICD-10-CM | POA: Diagnosis not present

## 2024-03-06 DIAGNOSIS — N3941 Urge incontinence: Secondary | ICD-10-CM | POA: Insufficient documentation

## 2024-03-06 DIAGNOSIS — G809 Cerebral palsy, unspecified: Secondary | ICD-10-CM | POA: Diagnosis present

## 2024-03-06 DIAGNOSIS — I639 Cerebral infarction, unspecified: Secondary | ICD-10-CM

## 2024-03-06 DIAGNOSIS — F71 Moderate intellectual disabilities: Secondary | ICD-10-CM | POA: Insufficient documentation

## 2024-03-06 DIAGNOSIS — G3189 Other specified degenerative diseases of nervous system: Secondary | ICD-10-CM | POA: Diagnosis not present

## 2024-03-06 DIAGNOSIS — R29706 NIHSS score 6: Secondary | ICD-10-CM

## 2024-03-06 DIAGNOSIS — R29818 Other symptoms and signs involving the nervous system: Secondary | ICD-10-CM | POA: Diagnosis present

## 2024-03-06 DIAGNOSIS — R7303 Prediabetes: Secondary | ICD-10-CM | POA: Insufficient documentation

## 2024-03-06 DIAGNOSIS — Z79899 Other long term (current) drug therapy: Secondary | ICD-10-CM | POA: Diagnosis not present

## 2024-03-06 DIAGNOSIS — R569 Unspecified convulsions: Secondary | ICD-10-CM

## 2024-03-06 DIAGNOSIS — N390 Urinary tract infection, site not specified: Principal | ICD-10-CM | POA: Insufficient documentation

## 2024-03-06 DIAGNOSIS — L209 Atopic dermatitis, unspecified: Secondary | ICD-10-CM | POA: Diagnosis not present

## 2024-03-06 DIAGNOSIS — Z0000 Encounter for general adult medical examination without abnormal findings: Secondary | ICD-10-CM

## 2024-03-06 DIAGNOSIS — G8114 Spastic hemiplegia affecting left nondominant side: Secondary | ICD-10-CM | POA: Diagnosis present

## 2024-03-06 LAB — COMPREHENSIVE METABOLIC PANEL WITH GFR
ALT: 16 U/L (ref 0–44)
AST: 32 U/L (ref 15–41)
Albumin: 4.1 g/dL (ref 3.5–5.0)
Alkaline Phosphatase: 72 U/L (ref 38–126)
Anion gap: 10 (ref 5–15)
BUN: 7 mg/dL (ref 6–20)
CO2: 28 mmol/L (ref 22–32)
Calcium: 9.5 mg/dL (ref 8.9–10.3)
Chloride: 102 mmol/L (ref 98–111)
Creatinine, Ser: 0.4 mg/dL — ABNORMAL LOW (ref 0.44–1.00)
GFR, Estimated: >60 mL/min
Glucose, Bld: 91 mg/dL (ref 70–99)
Potassium: 3.8 mmol/L (ref 3.5–5.1)
Sodium: 140 mmol/L (ref 135–145)
Total Bilirubin: <0.2 mg/dL (ref 0.0–1.2)
Total Protein: 7.2 g/dL (ref 6.5–8.1)

## 2024-03-06 LAB — DIFFERENTIAL
Abs Immature Granulocytes: 0.02 K/uL (ref 0.00–0.07)
Basophils Absolute: 0 K/uL (ref 0.0–0.1)
Basophils Relative: 1 %
Eosinophils Absolute: 0.1 K/uL (ref 0.0–0.5)
Eosinophils Relative: 2 %
Immature Granulocytes: 0 %
Lymphocytes Relative: 36 %
Lymphs Abs: 2.1 K/uL (ref 0.7–4.0)
Monocytes Absolute: 0.3 K/uL (ref 0.1–1.0)
Monocytes Relative: 5 %
Neutro Abs: 3.1 K/uL (ref 1.7–7.7)
Neutrophils Relative %: 56 %

## 2024-03-06 LAB — URINALYSIS, ROUTINE W REFLEX MICROSCOPIC
Bilirubin Urine: NEGATIVE
Glucose, UA: NEGATIVE mg/dL
Hgb urine dipstick: NEGATIVE
Ketones, ur: NEGATIVE mg/dL
Leukocytes,Ua: NEGATIVE
Nitrite: POSITIVE — AB
Protein, ur: NEGATIVE mg/dL
Specific Gravity, Urine: 1.01 (ref 1.005–1.030)
pH: 6 (ref 5.0–8.0)

## 2024-03-06 LAB — PROTIME-INR
INR: 1 (ref 0.8–1.2)
INR: 1 (ref 0.8–1.2)
Prothrombin Time: 13.3 s (ref 11.4–15.2)
Prothrombin Time: 14 s (ref 11.4–15.2)

## 2024-03-06 LAB — I-STAT CG4 LACTIC ACID, ED
Lactic Acid, Venous: 1.3 mmol/L (ref 0.5–1.9)
Lactic Acid, Venous: 0.8 mmol/L (ref 0.5–1.9)

## 2024-03-06 LAB — CBC
HCT: 41.5 % (ref 36.0–46.0)
Hemoglobin: 13.7 g/dL (ref 12.0–15.0)
MCH: 27.1 pg (ref 26.0–34.0)
MCHC: 33 g/dL (ref 30.0–36.0)
MCV: 82.2 fL (ref 80.0–100.0)
Platelets: 308 K/uL (ref 150–400)
RBC: 5.05 MIL/uL (ref 3.87–5.11)
RDW: 13.9 % (ref 11.5–15.5)
WBC: 5.7 K/uL (ref 4.0–10.5)
nRBC: 0 % (ref 0.0–0.2)

## 2024-03-06 LAB — APTT: aPTT: 28 s (ref 24–36)

## 2024-03-06 LAB — HCG, SERUM, QUALITATIVE: Preg, Serum: NEGATIVE

## 2024-03-06 LAB — AMMONIA: Ammonia: 33 umol/L (ref 9–35)

## 2024-03-06 LAB — CBG MONITORING, ED: Glucose-Capillary: 104 mg/dL — ABNORMAL HIGH (ref 70–99)

## 2024-03-06 LAB — ETHANOL: Alcohol, Ethyl (B): <15 mg/dL

## 2024-03-06 MED ORDER — GABAPENTIN 100 MG PO CAPS
100.0000 mg | ORAL_CAPSULE | ORAL | Status: DC
  Administered 2024-03-06: 100 mg via ORAL
  Filled 2024-03-06: qty 1

## 2024-03-06 MED ORDER — ACETAMINOPHEN 325 MG PO TABS: 650.0000 mg | ORAL_TABLET | Freq: Four times a day (QID) | ORAL | Status: DC | PRN

## 2024-03-06 MED ORDER — LORAZEPAM 0.5 MG PO TABS: 0.5000 mg | ORAL_TABLET | ORAL | Status: DC | PRN

## 2024-03-06 MED ORDER — IPRATROPIUM-ALBUTEROL 0.5-2.5 (3) MG/3ML IN SOLN: 3.0000 mL | Freq: Four times a day (QID) | RESPIRATORY_TRACT | Status: DC | PRN

## 2024-03-06 MED ORDER — LURASIDONE HCL 20 MG PO TABS
60.0000 mg | ORAL_TABLET | Freq: Every evening | ORAL | Status: DC
  Administered 2024-03-06: 60 mg via ORAL
  Filled 2024-03-06 (×3): qty 3

## 2024-03-06 MED ORDER — CARBAMAZEPINE 200 MG PO TABS
300.0000 mg | ORAL_TABLET | Freq: Two times a day (BID) | ORAL | Status: DC
  Administered 2024-03-06 – 2024-03-07 (×2): 300 mg via ORAL
  Filled 2024-03-06 (×3): qty 1.5

## 2024-03-06 MED ORDER — EZETIMIBE 10 MG PO TABS
10.0000 mg | ORAL_TABLET | Freq: Every evening | ORAL | Status: DC
  Administered 2024-03-06: 10 mg via ORAL
  Filled 2024-03-06: qty 1

## 2024-03-06 MED ORDER — LUBRIDERM SERIOUSLY SENSITIVE EX LOTN
1.0000 | TOPICAL_LOTION | Freq: Two times a day (BID) | CUTANEOUS | Status: DC
  Administered 2024-03-06 – 2024-03-07 (×2): 1 via TOPICAL
  Filled 2024-03-06: qty 473

## 2024-03-06 MED ORDER — FLUTICASONE PROPIONATE 50 MCG/ACT NA SUSP
2.0000 | Freq: Every day | NASAL | Status: DC
  Administered 2024-03-07: 2 via NASAL
  Filled 2024-03-06: qty 16

## 2024-03-06 MED ORDER — SODIUM CHLORIDE 0.9 % IV SOLN
1.0000 g | Freq: Once | INTRAVENOUS | Status: AC
  Administered 2024-03-06: 1 g via INTRAVENOUS
  Filled 2024-03-06: qty 10

## 2024-03-06 MED ORDER — HYDROCERIN EX CREA
TOPICAL_CREAM | Freq: Two times a day (BID) | CUTANEOUS | Status: DC
  Filled 2024-03-06: qty 113

## 2024-03-06 MED ORDER — MIRABEGRON ER 25 MG PO TB24
25.0000 mg | ORAL_TABLET | Freq: Every morning | ORAL | Status: DC
  Administered 2024-03-07: 25 mg via ORAL
  Filled 2024-03-06: qty 1

## 2024-03-06 MED ORDER — SODIUM CHLORIDE 0.9 % IV BOLUS
500.0000 mL | Freq: Once | INTRAVENOUS | Status: AC
  Administered 2024-03-06: 500 mL via INTRAVENOUS

## 2024-03-06 MED ORDER — ROSUVASTATIN CALCIUM 20 MG PO TABS
40.0000 mg | ORAL_TABLET | Freq: Every day | ORAL | Status: DC
  Administered 2024-03-06: 40 mg via ORAL
  Filled 2024-03-06: qty 2

## 2024-03-06 MED ORDER — VANCOMYCIN HCL 2000 MG/400ML IV SOLN
2000.0000 mg | Freq: Once | INTRAVENOUS | Status: DC
  Filled 2024-03-06: qty 400

## 2024-03-06 MED ORDER — POLYVINYL ALCOHOL 1.4 % OP SOLN: 1.0000 [drp] | OPHTHALMIC | Status: DC | PRN

## 2024-03-06 MED ORDER — POLYETHYLENE GLYCOL 3350 17 G PO PACK
17.0000 g | PACK | Freq: Every morning | ORAL | Status: DC
  Administered 2024-03-07: 17 g via ORAL
  Filled 2024-03-06: qty 1

## 2024-03-06 MED ORDER — SERTRALINE HCL 100 MG PO TABS
100.0000 mg | ORAL_TABLET | Freq: Two times a day (BID) | ORAL | Status: DC
  Administered 2024-03-06 – 2024-03-07 (×2): 100 mg via ORAL
  Filled 2024-03-06 (×2): qty 1

## 2024-03-06 MED ORDER — GABAPENTIN 100 MG PO CAPS
100.0000 mg | ORAL_CAPSULE | ORAL | Status: DC
  Administered 2024-03-07: 100 mg via ORAL
  Filled 2024-03-06: qty 1

## 2024-03-06 MED ORDER — SENNA 8.6 MG PO TABS
2.0000 | ORAL_TABLET | Freq: Every evening | ORAL | Status: DC
  Administered 2024-03-06: 17.2 mg via ORAL
  Filled 2024-03-06: qty 2

## 2024-03-06 MED ORDER — POLYVINYL ALCOHOL-POVIDONE PF 1.4-0.6 % OP SOLN: 1.0000 [drp] | OPHTHALMIC | Status: DC | PRN

## 2024-03-06 MED ORDER — DICLOFENAC SODIUM 1 % EX GEL
2.0000 g | Freq: Four times a day (QID) | CUTANEOUS | Status: DC
  Administered 2024-03-06 – 2024-03-07 (×2): 2 g via TOPICAL
  Filled 2024-03-06: qty 100

## 2024-03-06 MED ORDER — BACLOFEN 10 MG PO TABS
10.0000 mg | ORAL_TABLET | Freq: Three times a day (TID) | ORAL | Status: DC
  Administered 2024-03-06 – 2024-03-07 (×2): 10 mg via ORAL
  Filled 2024-03-06 (×2): qty 1

## 2024-03-06 MED ORDER — SODIUM CHLORIDE 0.9% FLUSH
3.0000 mL | Freq: Once | INTRAVENOUS | Status: AC
  Administered 2024-03-06: 3 mL via INTRAVENOUS

## 2024-03-06 MED ORDER — SODIUM CHLORIDE 0.9 % IV SOLN
1.0000 g | INTRAVENOUS | Status: DC
  Administered 2024-03-07: 1 g via INTRAVENOUS
  Filled 2024-03-06: qty 10

## 2024-03-06 MED ORDER — LORAZEPAM 1 MG PO TABS
1.0000 mg | ORAL_TABLET | Freq: Three times a day (TID) | ORAL | Status: DC
  Administered 2024-03-06 – 2024-03-07 (×3): 1 mg via ORAL
  Filled 2024-03-06 (×3): qty 1

## 2024-03-06 MED ORDER — EUCERIN EX LOTN: 1.0000 | TOPICAL_LOTION | Freq: Two times a day (BID) | CUTANEOUS | Status: DC

## 2024-03-06 MED ORDER — VANCOMYCIN HCL 1750 MG/350ML IV SOLN: 1750.0000 mg | INTRAVENOUS | Status: DC

## 2024-03-06 MED ORDER — ENOXAPARIN SODIUM 40 MG/0.4ML IJ SOSY
40.0000 mg | PREFILLED_SYRINGE | INTRAMUSCULAR | Status: DC
  Administered 2024-03-06: 40 mg via SUBCUTANEOUS
  Filled 2024-03-06: qty 0.4

## 2024-03-06 MED ORDER — GABAPENTIN 400 MG PO CAPS
1200.0000 mg | ORAL_CAPSULE | Freq: Every day | ORAL | Status: DC
  Administered 2024-03-06: 1200 mg via ORAL
  Filled 2024-03-06: qty 3
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
## 2024-03-06 NOTE — ED Notes (Signed)
 Recollected blue top sent

## 2024-03-06 NOTE — Code Documentation (Signed)
 Stroke Response Nurse Documentation Code Documentation  [name] is a 49 y.o. female arriving to [facility]  via [facility] on 03/06/2024 with past medical hx of hyperlipidemia, seizure, TBI, cerebral palsy, and left spastic hemiplegia. She uses a wheelchair baseline. On No antithrombotic. Code stroke was activated by EMS.   Patient from group home where she was LKW at bedside around 7pm and was noted to be leaning to the left with slurred speech this morning.  Stroke team at the bedside on patient arrival. Labs drawn and patient cleared for CT by Dr. Neysa. Patient to CT with team. NIHSS 14, see documentation for details and code stroke times. Patient with disoriented, left facial droop, bilateral arm weakness, bilateral leg weakness, and dysarthria  on exam. The following imaging was completed:  CT Head. Patient is not a candidate for IV Thrombolytic due to out of window. Patient is not a candidate for IR due to high MRS.   Care Plan: Q2 NIHSS and VS.   Process Delays Noted: difficult IV access  Bedside handoff with ED RN.    Madelin Manila Stroke Response RN

## 2024-03-06 NOTE — Progress Notes (Addendum)
 Pharmacy Antibiotic Note  [name] is a 49 y.o. female admitted on 03/06/2024 with lymphedema with surrounding infection per consult order.  Pharmacy has been consulted for vancomycin  dosing.  Plan: Vancomycin  2000mg  IV x1, then 1750mg  IV every 24 hours (AUC 479, used SCr 0.8) Montior renal function and culture results   ADDENDUM: Vancomycin  discontinued by primary team  Height: 5' 4 (162.6 cm) Weight: 76.9 kg (169 lb 8.5 oz) IBW/kg (Calculated) : 54.7  Temp (24hrs), Avg:98.9 F (37.2 C), Min:98.9 F (37.2 C), Max:98.9 F (37.2 C)  Recent Labs  Lab 03/06/24 0857 03/06/24 1105 03/06/24 1248  WBC 5.7  --   --   CREATININE 0.40*  --   --   LATICACIDVEN  --  1.3 0.8    Estimated Creatinine Clearance: 86.3 mL/min (A) (by C-G formula based on SCr of 0.4 mg/dL (L)).    Allergies[1]  Antimicrobials this admission: Ceftriaxone  1/8 >> Vancomycin  1/8 >>  Dose adjustments this admission:   Microbiology results: 1/8 UCx:     Thank you for allowing pharmacy to be a part of this patients care.  Harlene Boga, PharmD Please refer to [facility] for [facility] Pharmacy numbers 03/06/2024 7:00 PM     [1]  Allergies Allergen Reactions   Phenobarbital    Phenytoin

## 2024-03-06 NOTE — ED Notes (Addendum)
 Attempted to collect urine sample via bedpan, patient missed bedpan and unable to collect specimen.

## 2024-03-06 NOTE — ED Notes (Signed)
 This RN attempted IV twice, Chiquita, RN attempted IV once. Do not have IV access at this time. Phlebotomy attempted straight stick twice for labs with no success.

## 2024-03-06 NOTE — ED Notes (Signed)
 Patient transported to MRI
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
## 2024-03-06 NOTE — ED Provider Notes (Addendum)
 " [name] Provider Note   CSN: [id number] Arrival date & time: 03/06/24  9177     Patient presents with: Code Stroke   [name] is a 49 y.o. female.   49 year old female presenting emergency department as a code stroke.  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  Last known normal around bedtime 70 p.m. last night.  This morning with confusion and reported dysarthria.  Typically alert and oriented x 3 and able to carry on conversation per EMS.         Prior to Admission medications  Medication Sig Start Date End Date Taking? Authorizing Provider  acetaminophen  (TYLENOL ) 500 MG tablet Take 1,000 mg by mouth every 6 (six) hours as needed for moderate pain (pain score 4-6).   Yes [provider]  albuterol  (VENTOLIN  HFA) 108 (90 Base) MCG/ACT inhaler Inhale 1-2 puffs into the lungs every 4 (four) hours as needed for wheezing or shortness of breath.   Yes [provider]  ALLERGY RELIEF 25 MG tablet Take 25 mg by mouth every 4 (four) hours as needed for itching or allergies. 12/19/23  Yes [provider]  baclofen  (LIORESAL ) 10 MG tablet Take 1 tablet (10 mg total) by mouth 3 (three) times daily. 01/28/14  Yes Emokpae, Ejiroghene E, MD  Brompheniramine-Phenylephrine (DIMETAPP COLD/ALLERGY PO) Take 20 mLs by mouth every 4 (four) hours as needed (Cough).   Yes [provider]  Calcium  Carb-Cholecalciferol (OYSTER SHELL CALCIUM  W/D) 500-5 MG-MCG TABS Take 1 tablet by mouth 2 (two) times daily. 02/08/24  Yes [provider]  carbamazepine  (TEGRETOL ) 200 MG tablet Take 300 mg by mouth 2 (two) times daily.   Yes [provider]  cetirizine  (ZYRTEC ) 10 MG tablet Take 1 tablet (10 mg total) by mouth daily. 06/02/10  Yes Kalia-Reynolds, Maitri S, DO  clobetasol cream (TEMOVATE) 0.05 % Apply 1 Application topically as needed (Flare ups).  02/12/24  Yes [provider]  diclofenac  Sodium (VOLTAREN ) 1 % GEL Apply 2 g topically 4 (four) times daily. 02/03/23  Yes Long, Fonda MATSU, MD  Dupilumab (DUPIXENT) 300 MG/2ML SOAJ Inject 300 mg into the skin every 14 (fourteen) days.   Yes [provider]  Emollient (EUCERIN) lotion Apply 1 Application topically 2 (two) times daily.   Yes [provider]  ezetimibe  (ZETIA ) 10 MG tablet Take 10 mg by mouth every evening.   Yes [provider]  fluticasone  (FLONASE ) 50 MCG/ACT nasal spray Place 2 sprays into the nose daily. 07/04/12 03/06/24 Yes Kalia-Reynolds, Maitri S, DO  gabapentin  (NEURONTIN ) 100 MG capsule Take 100 mg by mouth 2 (two) times daily. 02/08/24  Yes [provider]  gabapentin  (NEURONTIN ) 400 MG capsule Take 1,200 mg by mouth at bedtime. 3 caps at bedtime po   Yes [provider]  loperamide (IMODIUM A-D) 2 MG tablet Take 2 mg by mouth as needed for diarrhea or loose stools.   Yes [provider]  LORazepam  (ATIVAN ) 0.5 MG tablet Take 0.5 mg by mouth every 4 (four) hours as needed for anxiety. 01/25/16  Yes [provider]  LORazepam  (ATIVAN ) 1 MG tablet Take 1 mg by mouth 3 (three) times daily. 10/18/16  Yes [provider]  Lurasidone  HCl 60 MG TABS Take 60 mg by mouth every evening. 02/08/24  Yes [provider]  metFORMIN (GLUCOPHAGE-XR) 500 MG 24 hr tablet Take 500 mg by mouth 2 (  two) times daily.   Yes [provider]  MYRBETRIQ  25 MG TB24 tablet Take 25 mg by mouth every morning. 02/14/24  Yes [provider]  oxybutynin (DITROPAN) 5 MG tablet Take 5 mg by mouth 2 (two) times daily. 02/08/24  Yes [provider]  polyethylene glycol powder (GLYCOLAX /MIRALAX ) 17 GM/SCOOP powder Take 17 g by mouth every morning. 02/08/24  Yes [provider]  REFRESH 1.4-0.6 % SOLN Place 1 drop into both eyes as needed (Dry eye). 12/19/23  Yes [provider]   rosuvastatin  (CRESTOR ) 40 MG tablet Take 40 mg by mouth at bedtime.   Yes [provider]  senna (SENOKOT) 8.6 MG TABS tablet Take 2 tablets by mouth every evening.   Yes [provider]  sertraline  (ZOLOFT ) 100 MG tablet Take 100 mg by mouth 2 (two) times daily.   Yes [provider]  STOMACH RELIEF 525 MG/30ML suspension Take 30 mLs by mouth every hour as needed for indigestion. 12/19/23  Yes [provider]  triamcinolone  cream (KENALOG ) 0.5 % Apply 1 Application topically 4 (four) times daily as needed (Irritation).   Yes [provider]    Allergies: Phenobarbital and Phenytoin    Review of Systems  Updated Vital Signs BP (!) 103/49 (BP Location: Right Arm)   Pulse 68   Temp (!) 97.5 F (36.4 C) (Oral)   Resp 20   Ht 5' 4 (1.626 m)   Wt 76.9 kg   SpO2 99%   BMI 29.10 kg/m   Physical Exam Vitals and nursing note reviewed.  Constitutional:      General: She is not in acute distress.    Appearance: She is not toxic-appearing.  HENT:     Head: Normocephalic.     Mouth/Throat:     Mouth: Mucous membranes are moist.  Eyes:     Conjunctiva/sclera: Conjunctivae normal.  Cardiovascular:     Rate and Rhythm: Normal rate.  Pulmonary:     Effort: Pulmonary effort is normal.  Abdominal:     General: There is no distension.  Skin:    General: Skin is warm.     Capillary Refill: Capillary refill takes less than 2 seconds.  Neurological:     Mental Status: She is alert.     Comments: Left-sided contracted.  Dysarthric.  Left-sided facial droop at rest.  No aphasia     (all labs ordered are listed, but only abnormal results are displayed) Labs Reviewed  URINE CULTURE - Abnormal; Notable for the following components:      Result Value   Culture >=100,000 COLONIES/mL ESCHERICHIA COLI (*)    Organism ID, Bacteria ESCHERICHIA COLI (*)    All other components within normal limits  COMPREHENSIVE METABOLIC PANEL WITH GFR - Abnormal;  Notable for the following components:   Creatinine, Ser 0.40 (*)    All other components within normal limits  URINALYSIS, ROUTINE W REFLEX MICROSCOPIC - Abnormal; Notable for the following components:   APPearance HAZY (*)    Nitrite POSITIVE (*)    Bacteria, UA RARE (*)    All other components within normal limits  CBC - Abnormal; Notable for the following components:   Hemoglobin 11.1 (*)    HCT 33.9 (*)    All other components within normal limits  BASIC METABOLIC PANEL WITH GFR - Abnormal; Notable for the following components:   Glucose, Bld 156 (*)    Creatinine, Ser 0.35 (*)    Calcium  8.8 (*)    All  other components within normal limits  CBG MONITORING, ED - Abnormal; Notable for the following components:   Glucose-Capillary 104 (*)    All other components within normal limits  PROTIME-INR  CBC  DIFFERENTIAL  ETHANOL  HCG, SERUM, QUALITATIVE  AMMONIA  PROTIME-INR  APTT  MAGNESIUM   HIV ANTIBODY (ROUTINE TESTING W REFLEX)  I-STAT CHEM 8, ED  CBG MONITORING, ED  I-STAT CG4 LACTIC ACID, ED  I-STAT CG4 LACTIC ACID, ED    EKG: EKG Interpretation Date/Time:  Thursday March 06 2024 08:46:42 EST Ventricular Rate:  80 PR Interval:  152 QRS Duration:  81 QT Interval:  371 QTC Calculation: 428 R Axis:   29  Text Interpretation: Sinus rhythm Ventricular premature complex Confirmed by Neysa Clap [phone]) on 03/06/2024 9:25:12 AM  Radiology: No results found.    Procedures   Medications Ordered in the ED  sodium chloride  flush (NS) 0.9 % injection 3 mL (3 mLs Intravenous Given 03/06/24 1332)  sodium chloride  0.9 % bolus 500 mL (0 mLs Intravenous Stopped 03/07/24 1132)  cefTRIAXone  (ROCEPHIN ) 1 g in sodium chloride  0.9 % 100 mL IVPB (0 g Intravenous Stopped 03/07/24 1132)  magnesium  sulfate IVPB 2 g 50 mL (0 g Intravenous Stopped 03/07/24 1133)    Clinical Course as of 03/18/24 0705  Thu Mar 06, 2024  0842 CT HEAD CODE STROKE WO CONTRAST IMPRESSION: 1. No acute  intracranial abnormality, ASPECTS 10. These results were communicated to Dr. JUDITHANN Raddle at 08:39 hours on January/09/2024 by text page via the [name] messaging system. 2. Stable since 2007, including severe nonspecific brainstem and cerebellar atrophy  Electronically signed by: Helayne Hurst MD MD 03/06/2024 08:40 AM EST RP Workstation: [id number]   [TY]  9157 Glucose-Capillary(!): 104 Altered mental status not secondary to hypoglycemia or hyperglycemia/DKA. [TY]  1001 MR BRAIN WO CONTRAST IMPRESSION: 1. No acute intracranial abnormality. 2. Severe nonspecific brainstem and cerebellar atrophy. Supratentorial volume also decreased for age. Nonspecific white matter changes most pronounced in the anterior superior frontal gyrus.   [TY]  1205 CBC No leukocytosis to suggest infectious process.  No anemia [TY]  1205 Comprehensive metabolic panel(!) No metabolic derangements.  Normal kidney function.  No transaminitis to suggest hepatic encephalopathy [TY]  1205 Preg, Serum: NEGATIVE [TY]  1205 Ammonia: 33 Unlikely cause of patient's confusion [TY]  1205 Lactic Acid, Venous: 1.3 Systemic infection unlikely.  Seizure less likely [TY]  1206 Alcohol , Ethyl (B): <15 No history of alcohol  abuse.  Unlikely withdrawal related confusion [TY]  1311 Family member present at bedside.  Noted the patient's mentation improving, but not back to baseline still.  Workup concerning for UTI.  She is also on several centrally acting medications, baclofen , Ativan .  Could be of polypharmacy as well.  Will dose Rocephin .  Will admit for observation and further treatment of UTI. [TY]    Clinical Course User Index [TY] Neysa Clap PARAS, DO                                 Medical Decision Making 49 year old female complex past medical history to include seizure disorder, cerebral palsy, GERD, hyperlipidemia.  Does have left-sided contractures secondary to her cerebral palsy.  EMS reported stable vitals and route.   Last known normal around 7 to 8 PM last night.  Did not appreciate obvious abnormalities on her noncontrasted CT scan in the CT scanner room.  Neurology recommending MRI and further workup for confusion/encephalopathy.  CT course for further MDM final disposition  Amount and/or Complexity of Data Reviewed Independent Historian: EMS    Details: Provided HPI External Data Reviewed:     Details: Not on blood thinner, but is on several centrally acting medications Labs: ordered. Decision-making details documented in ED Course. Radiology: ordered and independent interpretation performed. Decision-making details documented in ED Course.    Details: Do not appreciate obvious intracranial hemorrhage or pathology. ECG/medicine tests: ordered and independent interpretation performed.    Details: No ischemic changes  Risk Decision regarding hospitalization. Diagnosis or treatment significantly limited by social determinants of health. Risk Details: Poor health literacy.    PTT and PT/INR ordered as part of the stroke panel as patient presented as a code stroke.   Final diagnoses:  Delirium  Preventative health care    ED Discharge Orders          Ordered    cefUROXime  (CEFTIN ) 500 MG tablet  2 times daily with meals        03/07/24 1055    Increase activity slowly        03/07/24 1103    Discharge instructions       Comments: Thank you for allowing us  to be part of your care. You were hospitalized for a change from baseline and found to have a urinary tract infection. We treated you with antibiotics.   We have not changed any of your at home medications.  *For your urinary tract infection -We have sent you home with an antibiotic called Cefuroxime . Please take one tablet tomorrow (1/10) in the morning and another one tomorrow evening. This will complete a total three day course of antibiotics.    FOLLOW UP APPOINTMENTS:  Please make sure to see your PCP within 7 - 10 days of  leaving the hospital  Please call your PCP or our clinic if you have any questions or concerns, we may be able to help and keep you from a long and expensive emergency room wait. Our clinic and after hours phone number is [phone]. The best time to call is Monday through Friday 9 am to 4 pm but there is always someone available 24/7 if you have an emergency. If you need medication refills please notify your pharmacy one week in advance and they will send us  a request.   We are glad you are feeling better,  Viktoria King Internal Medicine Inpatient Teaching Service at [facility]   03/07/24 1103    Diet Carb Modified        03/07/24 1103    Call MD for:  temperature >100.4        03/07/24 1103    Call MD for:  severe uncontrolled pain        03/07/24 1103               Neysa Caron PARAS, DO 03/06/24 1535    Neysa Caron PARAS, DO 03/18/24 [phone]  "

## 2024-03-06 NOTE — ED Triage Notes (Addendum)
 Pt BIB [facility] from group home off [address]. Patient LKW reported by director of group home was 1600 yesterday afternoon. Patient said she went to bed feeling normal at 7 or 8pm last night. Patient c/o back pain. Deficits include slurred speech, L sided weakness, expressive aphasia, confusion and facial droop. Baseline contracture on L side from previous TIA. Patient not on blood thinners. A&O x4 at baseline.   VS 124/73, 129 CBG, 68 HR NSR with PVCs, 100% RA

## 2024-03-06 NOTE — ED Notes (Signed)
 IV team @ bedside but patient in XRAY. IV team will consult at a later time

## 2024-03-07 ENCOUNTER — Encounter (HOSPITAL_COMMUNITY): Payer: Self-pay | Admitting: Internal Medicine

## 2024-03-07 ENCOUNTER — Other Ambulatory Visit (HOSPITAL_COMMUNITY): Payer: Self-pay

## 2024-03-07 DIAGNOSIS — Z79899 Other long term (current) drug therapy: Secondary | ICD-10-CM | POA: Diagnosis not present

## 2024-03-07 DIAGNOSIS — N3941 Urge incontinence: Secondary | ICD-10-CM

## 2024-03-07 DIAGNOSIS — G809 Cerebral palsy, unspecified: Secondary | ICD-10-CM | POA: Diagnosis not present

## 2024-03-07 DIAGNOSIS — N390 Urinary tract infection, site not specified: Secondary | ICD-10-CM

## 2024-03-07 DIAGNOSIS — R7303 Prediabetes: Secondary | ICD-10-CM

## 2024-03-07 LAB — BASIC METABOLIC PANEL WITH GFR
Anion gap: 8 (ref 5–15)
BUN: 10 mg/dL (ref 6–20)
CO2: 28 mmol/L (ref 22–32)
Calcium: 8.8 mg/dL — ABNORMAL LOW (ref 8.9–10.3)
Chloride: 101 mmol/L (ref 98–111)
Creatinine, Ser: 0.35 mg/dL — ABNORMAL LOW (ref 0.44–1.00)
GFR, Estimated: >60 mL/min
Glucose, Bld: 156 mg/dL — ABNORMAL HIGH (ref 70–99)
Potassium: 3.6 mmol/L (ref 3.5–5.1)
Sodium: 138 mmol/L (ref 135–145)

## 2024-03-07 LAB — CBC
HCT: 33.9 % — ABNORMAL LOW (ref 36.0–46.0)
Hemoglobin: 11.1 g/dL — ABNORMAL LOW (ref 12.0–15.0)
MCH: 26.7 pg (ref 26.0–34.0)
MCHC: 32.7 g/dL (ref 30.0–36.0)
MCV: 81.5 fL (ref 80.0–100.0)
Platelets: 324 K/uL (ref 150–400)
RBC: 4.16 MIL/uL (ref 3.87–5.11)
RDW: 13.7 % (ref 11.5–15.5)
WBC: 7.4 K/uL (ref 4.0–10.5)
nRBC: 0 % (ref 0.0–0.2)

## 2024-03-07 LAB — MAGNESIUM: Magnesium: 1.8 mg/dL (ref 1.7–2.4)

## 2024-03-07 LAB — HIV ANTIBODY (ROUTINE TESTING W REFLEX): HIV Screen 4th Generation wRfx: NONREACTIVE

## 2024-03-07 MED ORDER — CEFUROXIME AXETIL 500 MG PO TABS
500.0000 mg | ORAL_TABLET | Freq: Two times a day (BID) | ORAL | 0 refills | Status: AC
  Filled 2024-03-07: qty 2, 1d supply, fill #0

## 2024-03-07 MED ORDER — MAGNESIUM SULFATE 2 GM/50ML IV SOLN
2.0000 g | Freq: Once | INTRAVENOUS | Status: AC
  Administered 2024-03-07: 2 g via INTRAVENOUS
  Filled 2024-03-07: qty 50
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
## 2024-03-07 NOTE — Plan of Care (Signed)

## 2024-03-07 NOTE — Evaluation (Signed)
 Physical Therapy Evaluation Patient Details Name: [name] MRN: [id number] DOB: February 15, 1976 Today's Date: 03/07/2024  History of Present Illness  49 y.o. F who presented to [facility] 03/06/24 for UTI. Her cousins report the pt has been more tired lately, talking less, and favoring her left side more than usual. CT & MRI negative w/ stable atrophy since 2007. 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   Clinical Impression  Pt admitted with above diagnosis. PTA, pt required assistance with functional mobility, ADLs, and IADLs. She resides in a ground home with 24/7 supervision and assist. Pt previously was transferring via a squat pivot into her manual w/c, which she required assistance to propel. Pt currently with functional limitations due to the deficits listed below (see PT Problem List). She required modA for bed mobility and bed<>w/c transfers. Introduced hydrologist and educated pt/family on proper use. Educated on head-hips relationship and lift, shift, lower technique. Demonstrated proper use of device and helped pt practice with a manual w/c in the hospital. Unfortunately, her w/c was not brought into the hospital to be able to practice with her specific equipment. Pt will benefit from acute skilled PT to increase her independence and safety with mobility to allow discharge home with HHPT.    If plan is discharge home, recommend the following: A lot of help with walking and/or transfers;A lot of help with bathing/dressing/bathroom;Assistance with cooking/housework;Assistance with feeding;Assist for transportation;Help with stairs or ramp for entrance   Can travel by private vehicle        Equipment Recommendations Other (comment) (sliding board; electric/power wheelchair)  Recommendations for Other Services       Functional Status Assessment Patient has had a recent decline in their functional status and  demonstrates the ability to make significant improvements in function in a reasonable and predictable amount of time.     Precautions / Restrictions Precautions Precautions: Fall Recall of Precautions/Restrictions: Intact Restrictions Weight Bearing Restrictions Per Provider Order: No      Mobility  Bed Mobility Overal bed mobility: Needs Assistance Bed Mobility: Supine to Sit     Supine to sit: Mod assist     General bed mobility comments: Pt sat up on R side of bed with increased time. Assist via pad to bring BLE off EOB, hips fwd, and trunk upright.    Transfers Overall transfer level: Needs assistance Equipment used: Sliding board Transfers: Bed to chair/wheelchair/BSC       Squat pivot transfers: Mod assist    Lateral/Scoot Transfers: Mod assist General transfer comment: Introduced hydrologist and educated pt/family on proper use. Demonstrated lift, shift, lower technique. Pt transferred to/from manual w/c with sliding board. Leg rests were removed and arm rest was elevated out of the away. ModA to advance pt. Cues for sequencing. Educated pt/family on heads-hip relationship. Increased difficulty going to the left. Pt completed a squat pivot transfer to transport chair in order to be d/c'd    Ambulation/Gait               General Gait Details: NT - pt nonambulatory at baseline  Stairs            Wheelchair Mobility     Tilt Bed    Modified Rankin (Stroke Patients Only)       Balance Overall balance assessment: Needs assistance Sitting-balance support: Single extremity supported, Feet supported Sitting balance-Leahy Scale: Fair Sitting balance - Comments: Sat EOB and engaged with scooting.  Pertinent Vitals/Pain Pain Assessment Pain Assessment: No/denies pain    Home Living Family/patient expects to be discharged to:: Group home Living Arrangements: Group Home Available Help at  Discharge: Available 24 hours/day Type of Home: House Home Access: Level entry       Home Layout: One level Home Equipment: Wheelchair - manual;Shower seat;Hospital bed Additional Comments: Pt is pending a w/c assessment and family reports looking into a power/electric chair instead.    Prior Function Prior Level of Function : Needs assist       Physical Assist : Mobility (physical);ADLs (physical) Mobility (physical): Bed mobility;Transfers;Gait ADLs (physical): Bathing;Dressing;Toileting;IADLs;Grooming Mobility Comments: Pt has 1+ assist for all mobility. She generally can squat pivot to transfer to/from w/c. Pt relies on assistance to mobilize in w/c. Denies fall hx. ADLs Comments: Pt required assist for ADLs and setup for meals, but is able to self-feed. Pt wears briefs.     Extremity/Trunk Assessment   Upper Extremity Assessment Upper Extremity Assessment: Defer to OT evaluation LUE Deficits / Details: baseline spasticity with flexion contractures at elbow wrist adducted to chest    Lower Extremity Assessment Lower Extremity Assessment: RLE deficits/detail;LLE deficits/detail RLE Deficits / Details: Decreased AROM. PROM WFL. Generalized weakness, at least 3/5. LLE Deficits / Details: Hx of spastic hemiplegia. No active ankle motion. LLE Coordination: decreased gross motor    Cervical / Trunk Assessment Cervical / Trunk Assessment: Other exceptions (baseline CP with flexed posture)  Communication   Communication Communication: Impaired Factors Affecting Communication: Reduced clarity of speech    Cognition Arousal: Alert Behavior During Therapy: WFL for tasks assessed/performed   PT - Cognitive impairments: History of cognitive impairments (intellectual disability)                         Following commands: Intact       Cueing Cueing Techniques: Verbal cues, Gestural cues, Tactile cues     General Comments General comments (skin integrity, edema,  etc.): Cousin present and supportive throughout session.    Exercises     Assessment/Plan    PT Assessment Patient needs continued PT services  PT Problem List Decreased strength;Decreased activity tolerance;Decreased balance;Decreased mobility;Decreased knowledge of use of DME       PT Treatment Interventions DME instruction;Functional mobility training;Therapeutic activities;Therapeutic exercise;Balance training;Patient/family education;Wheelchair mobility training    PT Goals (Current goals can be found in the Care Plan section)  Acute Rehab PT Goals Patient Stated Goal: Return Home PT Goal Formulation: With patient/family Time For Goal Achievement: 03/21/24 Potential to Achieve Goals: Good    Frequency Min 2X/week     Co-evaluation               AM-PAC PT 6 Clicks Mobility  Outcome Measure Help needed turning from your back to your side while in a flat bed without using bedrails?: A Little Help needed moving from lying on your back to sitting on the side of a flat bed without using bedrails?: A Lot Help needed moving to and from a bed to a chair (including a wheelchair)?: A Lot Help needed standing up from a chair using your arms (e.g., wheelchair or bedside chair)?: A Lot Help needed to walk in hospital room?: Total Help needed climbing 3-5 steps with a railing? : Total 6 Click Score: 11    End of Session   Activity Tolerance: Patient tolerated treatment well Patient left: Other (comment) (in transport chair with cousin actively d/c'ing) Nurse Communication: Other (comment) (pt's transportation  present ready to d/c) PT Visit Diagnosis: Other abnormalities of gait and mobility (R26.89);Muscle weakness (generalized) (M62.81)    Time: 8789-8756 PT Time Calculation (min) (ACUTE ONLY): 33 min   Charges:   PT Evaluation $PT Eval Moderate Complexity: 1 Mod PT Treatments $Therapeutic Activity: 8-22 mins PT General Charges $$ ACUTE PT VISIT: 1 Visit          Randall SAUNDERS, PT, DPT Acute Rehabilitation Services Office: [phone] Secure Chat Preferred  [name] 03/07/2024, 2:03 PM

## 2024-03-07 NOTE — Progress Notes (Addendum)
 Reviewed AVS, patient expressed understanding of medications, MD follow up reviewed.   Removed IV, Site clean, dry and intact.  See LDA for information on wounds at discharge. CCMD contacted and informed patients is being discharged.  Patient states all belongings brought to the hospital at time of admission are accounted for and packed to take home.  Picked up medications from [facility] pharmacy. Patient is transferring via private transport.  Ride is not here at this time.  OT placed order for Sliding Board to assist with transfer.  Pt. Waiting in room.  Called Etta at [facility] Home to give report.  Verbalized understanding.  PT and OT working on getting a sliding board due to lack of mobility.

## 2024-03-07 NOTE — Discharge Instructions (Signed)
 Thank you for allowing us  to be part of your care. You were hospitalized for a change from baseline and found to have a urinary tract infection. We treated you with antibiotics.   We have not changed any of your at home medications.  *For your urinary tract infection -We have sent you home with an antibiotic called Cefuroxime . Please take one tablet tomorrow (1/10) in the morning and another one tomorrow evening. This will complete a total three day course of antibiotics.    FOLLOW UP APPOINTMENTS:  Please make sure to see your PCP within 7 - 10 days of leaving the hospital  Please call your PCP or our clinic if you have any questions or concerns, we may be able to help and keep you from a long and expensive emergency room wait. Our clinic and after hours phone number is [phone]. The best time to call is Monday through Friday 9 am to 4 pm but there is always someone available 24/7 if you have an emergency. If you need medication refills please notify your pharmacy one week in advance and they will send us  a request.   We are glad you are feeling better,  Viktoria King Internal Medicine Inpatient Teaching Service at [facility]

## 2024-03-07 NOTE — TOC Transition Note (Signed)
 Transition of Care [facility]) - Discharge Note   Patient Details  Name: ANIJA BRICKNER MRN: [id number] Date of Birth: February 22, 1976  Transition of Care [facility]) CM/SW Contact:  Gwenn Julien Norris, LCSW Phone Number: 03/07/2024, 9:21 AM   Clinical Narrative:   Pt for dc back to [facility] group home at [address] today. Spoke to [facility] manager Nicolette [phone] who confirmed pt is able to return and they will provide transport. DC summary faxed to facility [phone]. SW signing off at dc.   Julien Gwenn, MSW, LCSW [phone] (coverage)      Final next level of care: Group Home Barriers to Discharge: Barriers Resolved   Patient Goals and CMS Choice            Discharge Placement                Patient to be transferred to facility by: Facility to transport Name of family member notified: Et[facility] manager Patient and family notified of of transfer: 03/07/24  Discharge Plan and Services Additional resources added to the After Visit Summary for                                       Social Drivers of Health (SDOH) Interventions SDOH Screenings   Tobacco Use: Low Risk (03/16/2023)     Readmission Risk Interventions     No data to display

## 2024-03-07 NOTE — Evaluation (Signed)
 Occupational Therapy Evaluation Patient Details Name: [name] MRN: [id number] DOB: [birth year] Today's Date: 03/07/2024   History of Present Illness   49 y.o. F who [facility] for UTI. Her cousins report the pt has been more tired lately, talking less, and favoring her left side more than usual. CT & MRI negative w/ stable atrophy since 2007. 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     Clinical Impressions PT admitted with UTI. Pt currently with functional limitiations due to the deficits listed below (see OT problem list). Pt with R side weakness affecting transfer to w/c. Recommendation for sliding board and pending d/c today. CM placed order for sliding board and RN notified to wait for equipment as pt is ready for d/c. Recommendation for continued therapy as pt at baseline squat pivots with caregiver (A)  Pt will benefit from skilled OT to increase their independence and safety with adls and balance to allow discharge hhot.      If plan is discharge home, recommend the following:   Assist for transportation     Functional Status Assessment   Patient has had a recent decline in their functional status and demonstrates the ability to make significant improvements in function in a reasonable and predictable amount of time.     Equipment Recommendations   Wheelchair cushion (measurements OT);Wheelchair (measurements OT);Other (comment) (sliding board -- could benefit from a w/c assessment)     Recommendations for Other Services         Precautions/Restrictions   Precautions Precautions: Fall Recall of Precautions/Restrictions: Intact     Mobility Bed Mobility Overal bed mobility: Needs Assistance Bed Mobility: Supine to Sit     Supine to sit: Mod assist     General bed mobility comments: pt progressed to R side of the bed with (A) of therapist and pad. pt with  static sitting balance. pt able to reach outside BOS showing some dynamic sitting balance    Transfers                   General transfer comment: sit<>Stand squat max (A). called CM Kelli to request a sliding board for transfer safety leaving [facility] back to group home. Notified PT need to help educate further on sliding board transfer once arrived to room with pending d/c. RN notified of needs prior to officially d/c      Balance Overall balance assessment: Needs assistance Sitting-balance support: Single extremity supported, Feet supported Sitting balance-Leahy Scale: Fair                                     ADL either performed or assessed with clinical judgement   ADL Overall ADL's : Needs assistance/impaired Eating/Feeding: Set up;Bed level   Grooming: Minimal assistance;Bed level   Upper Body Bathing: Moderate assistance   Lower Body Bathing: Total assistance   Upper Body Dressing : Moderate assistance   Lower Body Dressing: Total assistance                       Vision Ability to See in Adequate Light: 0 Adequate Patient Visual Report: No change from baseline       Perception         Praxis         Pertinent Vitals/Pain Pain Assessment Pain Assessment: No/denies pain  Extremity/Trunk Assessment Upper Extremity Assessment Upper Extremity Assessment: Right hand dominant;LUE deficits/detail LUE Deficits / Details: baseline spasticity with flexion contractures at elbow wrist adducted to chest   Lower Extremity Assessment Lower Extremity Assessment: Generalized weakness;RLE deficits/detail RLE Deficits / Details: blocking requires for attempts to elevate from surface to simulate squat pivot   Cervical / Trunk Assessment Cervical / Trunk Assessment: Other exceptions (baseline CP with flexed posture)   Communication Communication Communication: Impaired Factors Affecting Communication: Reduced clarity of speech    Cognition Arousal: Alert Behavior During Therapy: WFL for tasks assessed/performed Cognition: Cognition impaired             OT - Cognition Comments: cousin Grayce present to give answers and verify information. Group home called during session to verify facilites ability to (A) patient                 Following commands: Intact       Cueing  General Comments          Exercises     Shoulder Instructions      Home Living Family/patient expects to be discharged to:: Group home Living Arrangements: Group Home Available Help at Discharge: Available 24 hours/day Type of Home: House Home Access: Level entry     Home Layout: One level     Bathroom Shower/Tub: Chief Strategy Officer: Handicapped height Bathroom Accessibility: Yes How Accessible: Accessible via wheelchair;Accessible via walker Home Equipment: Wheelchair - manual;Shower seat;Hospital bed          Prior Functioning/Environment Prior Level of Function : Needs assist             Mobility Comments: squat pivot to chair normally ADLs Comments: pt at baseline requires (A) for all adls and setup for self feeding indep    OT Problem List: Decreased activity tolerance;Impaired balance (sitting and/or standing);Decreased strength;Decreased safety awareness   OT Treatment/Interventions: Self-care/ADL training;DME and/or AE instruction;Therapeutic activities;Balance training;Patient/family education;Therapeutic exercise      OT Goals(Current goals can be found in the care plan section)   Acute Rehab OT Goals Patient Stated Goal: none stated OT Goal Formulation: With patient/family Time For Goal Achievement: 03/21/24 Potential to Achieve Goals: Good   OT Frequency:  Min 2X/week    Co-evaluation              AM-PAC OT 6 Clicks Daily Activity     Outcome Measure Help from another person eating meals?: A Little Help from another person taking care of personal grooming?:  A Little Help from another person toileting, which includes using toliet, bedpan, or urinal?: Total Help from another person bathing (including washing, rinsing, drying)?: Total Help from another person to put on and taking off regular upper body clothing?: A Lot Help from another person to put on and taking off regular lower body clothing?: Total 6 Click Score: 11   End of Session Nurse Communication: Mobility status;Precautions;Need for lift equipment  Activity Tolerance: Patient tolerated treatment well Patient left: in bed;with call bell/phone within reach;with nursing/sitter in room;with family/visitor present (pt sitting eob with RN present d/cing the IV from R UE)  OT Visit Diagnosis: Unsteadiness on feet (R26.81)                Time: 1133-1150 OT Time Calculation (min): 17 min Charges:  OT General Charges $OT Visit: 1 Visit OT Evaluation $OT Eval Moderate Complexity: 1 Mod   Brynn, OTR/L  Acute Rehabilitation Services Office: [phone] .   Ely Molt  03/07/2024, 12:08 PM

## 2024-03-08 LAB — URINE CULTURE: Culture: >=100000 — AB

## 2024-03-14 ENCOUNTER — Encounter: Payer: Self-pay | Admitting: Family Medicine

## 2024-03-18 ENCOUNTER — Other Ambulatory Visit: Payer: Self-pay | Admitting: Family Medicine

## 2024-03-18 ENCOUNTER — Ambulatory Visit
Admission: RE | Admit: 2024-03-18 | Discharge: 2024-03-18 | Disposition: A | Source: Ambulatory Visit | Attending: Internal Medicine | Admitting: Internal Medicine

## 2024-03-18 ENCOUNTER — Ambulatory Visit
Admission: RE | Admit: 2024-03-18 | Discharge: 2024-03-18 | Disposition: A | Source: Ambulatory Visit | Attending: Family Medicine | Admitting: Family Medicine

## 2024-03-18 DIAGNOSIS — Z1231 Encounter for screening mammogram for malignant neoplasm of breast: Secondary | ICD-10-CM

## 2024-03-18 DIAGNOSIS — M25461 Effusion, right knee: Secondary | ICD-10-CM
